# Patient Record
Sex: Female | Born: 1975 | Race: White | Hispanic: No | Marital: Married | Smoking: Current every day smoker
Health system: Southern US, Community
[De-identification: ages and names within clinical notes are randomized; demographics above are authoritative.]

## PROBLEM LIST (undated history)

## (undated) DIAGNOSIS — G8929 Other chronic pain: Secondary | ICD-10-CM

## (undated) DIAGNOSIS — D649 Anemia, unspecified: Secondary | ICD-10-CM

## (undated) HISTORY — DX: Other chronic pain: G89.29

## (undated) HISTORY — DX: Anemia, unspecified: D64.9

## (undated) HISTORY — PX: DILATION AND CURETTAGE OF UTERUS: SHX78

---

## 2001-04-20 ENCOUNTER — Emergency Department (HOSPITAL_COMMUNITY): Admission: EM | Admit: 2001-04-20 | Discharge: 2001-04-21 | Payer: Self-pay | Admitting: Emergency Medicine

## 2001-12-21 ENCOUNTER — Emergency Department (HOSPITAL_COMMUNITY): Admission: EM | Admit: 2001-12-21 | Discharge: 2001-12-21 | Payer: Self-pay | Admitting: Emergency Medicine

## 2003-05-30 ENCOUNTER — Emergency Department (HOSPITAL_COMMUNITY): Admission: EM | Admit: 2003-05-30 | Discharge: 2003-05-30 | Payer: Self-pay | Admitting: Emergency Medicine

## 2003-06-28 ENCOUNTER — Ambulatory Visit (HOSPITAL_COMMUNITY): Admission: RE | Admit: 2003-06-28 | Discharge: 2003-06-28 | Payer: Self-pay | Admitting: Obstetrics and Gynecology

## 2004-01-15 ENCOUNTER — Emergency Department (HOSPITAL_COMMUNITY): Admission: EM | Admit: 2004-01-15 | Discharge: 2004-01-15 | Payer: Self-pay | Admitting: Emergency Medicine

## 2004-02-08 ENCOUNTER — Ambulatory Visit (HOSPITAL_COMMUNITY): Admission: AD | Admit: 2004-02-08 | Discharge: 2004-02-08 | Payer: Self-pay | Admitting: Obstetrics and Gynecology

## 2004-03-21 ENCOUNTER — Ambulatory Visit (HOSPITAL_COMMUNITY): Admission: RE | Admit: 2004-03-21 | Discharge: 2004-03-21 | Payer: Self-pay | Admitting: Obstetrics and Gynecology

## 2004-05-09 ENCOUNTER — Ambulatory Visit (HOSPITAL_COMMUNITY): Admission: AD | Admit: 2004-05-09 | Discharge: 2004-05-09 | Payer: Self-pay | Admitting: Obstetrics and Gynecology

## 2004-05-18 ENCOUNTER — Ambulatory Visit (HOSPITAL_COMMUNITY): Admission: AD | Admit: 2004-05-18 | Discharge: 2004-05-18 | Payer: Self-pay | Admitting: Obstetrics and Gynecology

## 2004-05-26 ENCOUNTER — Ambulatory Visit (HOSPITAL_COMMUNITY): Admission: AD | Admit: 2004-05-26 | Discharge: 2004-05-26 | Payer: Self-pay | Admitting: Obstetrics and Gynecology

## 2004-06-06 ENCOUNTER — Ambulatory Visit (HOSPITAL_COMMUNITY): Admission: AD | Admit: 2004-06-06 | Discharge: 2004-06-06 | Payer: Self-pay | Admitting: Obstetrics and Gynecology

## 2004-06-10 ENCOUNTER — Observation Stay (HOSPITAL_COMMUNITY): Admission: AD | Admit: 2004-06-10 | Discharge: 2004-06-11 | Payer: Self-pay | Admitting: Obstetrics and Gynecology

## 2004-06-12 ENCOUNTER — Ambulatory Visit (HOSPITAL_COMMUNITY): Admission: AD | Admit: 2004-06-12 | Discharge: 2004-06-12 | Payer: Self-pay | Admitting: Obstetrics and Gynecology

## 2004-06-22 ENCOUNTER — Ambulatory Visit (HOSPITAL_COMMUNITY): Admission: AD | Admit: 2004-06-22 | Discharge: 2004-06-22 | Payer: Self-pay | Admitting: Obstetrics & Gynecology

## 2004-07-01 ENCOUNTER — Inpatient Hospital Stay (HOSPITAL_COMMUNITY): Admission: AD | Admit: 2004-07-01 | Discharge: 2004-07-03 | Payer: Self-pay | Admitting: Obstetrics and Gynecology

## 2004-08-01 ENCOUNTER — Ambulatory Visit (HOSPITAL_COMMUNITY): Admission: RE | Admit: 2004-08-01 | Discharge: 2004-08-01 | Payer: Self-pay | Admitting: Obstetrics and Gynecology

## 2005-07-30 ENCOUNTER — Emergency Department (HOSPITAL_COMMUNITY): Admission: EM | Admit: 2005-07-30 | Discharge: 2005-07-30 | Payer: Self-pay | Admitting: Emergency Medicine

## 2005-09-25 ENCOUNTER — Emergency Department (HOSPITAL_COMMUNITY): Admission: EM | Admit: 2005-09-25 | Discharge: 2005-09-25 | Payer: Self-pay | Admitting: Emergency Medicine

## 2006-07-01 ENCOUNTER — Emergency Department (HOSPITAL_COMMUNITY): Admission: EM | Admit: 2006-07-01 | Discharge: 2006-07-01 | Payer: Self-pay | Admitting: Emergency Medicine

## 2006-10-02 ENCOUNTER — Emergency Department (HOSPITAL_COMMUNITY): Admission: EM | Admit: 2006-10-02 | Discharge: 2006-10-02 | Payer: Self-pay | Admitting: Emergency Medicine

## 2007-02-12 ENCOUNTER — Emergency Department (HOSPITAL_COMMUNITY): Admission: EM | Admit: 2007-02-12 | Discharge: 2007-02-12 | Payer: Self-pay | Admitting: Emergency Medicine

## 2008-04-26 ENCOUNTER — Emergency Department (HOSPITAL_COMMUNITY): Admission: EM | Admit: 2008-04-26 | Discharge: 2008-04-26 | Payer: Self-pay | Admitting: Emergency Medicine

## 2010-05-29 ENCOUNTER — Emergency Department (HOSPITAL_COMMUNITY): Admission: EM | Admit: 2010-05-29 | Discharge: 2010-05-29 | Payer: Self-pay | Admitting: Emergency Medicine

## 2011-05-03 NOTE — Consult Note (Signed)
Amy Andersen, Amy Andersen NO.:  [id number]   MEDICAL RECORD NO.:  [id number]                   PATIENT TYPE:   LOCATION:                                       FACILITY:   PHYSICIAN:  Lazaro Arms, M.D.                DATE OF BIRTH:   DATE OF CONSULTATION:  05/09/2004  DATE OF DISCHARGE:                                   CONSULTATION   Patient presented to labor and delivery complaining of lower abdominal  pressure.  She is 32 weeks and 6 days.  She is gravida 4, para 2, abortus 1,  estimated date of delivery 06/28/2004.  She has had no bleeding, no gushes  of fluid, no contractions just low abdominal pressure.  Her cervix is long,  thick and closed.  There are no contractions on the monitor.  She has  reactive NST.  She is discharged to home; follow up in the office as needed  or come back as needed.      ___________________________________________                                            Lazaro Arms, M.D.   Amy Andersen  D:  05/11/2004  T:  05/11/2004  Job:  [id number]

## 2011-05-03 NOTE — Op Note (Signed)
   [name], Amy Andersen                     ACCOUNT NO.:  [id number]   MEDICAL RECORD NO.:  [id number]                   PATIENT TYPE:  AMB   LOCATION:  DAY                                  FACILITY:  [facility]   PHYSICIAN:  Tilda Burrow, M.D.              DATE OF BIRTH:  1976-07-09   DATE OF PROCEDURE:  DATE OF DISCHARGE:                                 OPERATIVE REPORT   PREOPERATIVE DIAGNOSIS:  Incomplete abortion.   POSTOPERATIVE DIAGNOSIS:  Incomplete abortion.   PROCEDURE:  Suction dilatation and curettage.   SURGEON:  Tilda Burrow, M.D.   ASSISTANT:  None   ANESTHESIA:  Paracervical block with IV sedation   COMPLICATIONS:  None   SURGEON:  Tilda Burrow, M.D.   ASSISTANT:  None.   ANESTHESIA:  Spinal   COMPLICATIONS:  None.   FINDINGS:  Uterus sounding to 9 cm.  Generous products of conception and  tissue and old blood consistent with missed AB.   DETAILS OF PROCEDURE:  The patient was taken to the operating room and  prepped and draped in the usual fashion for vaginal procedure with speculum  inserted.  Four green towels placed in periphery of the introitus.  The  vagina prepped with Betadine, cervix infiltrated with 1% Xylocaine, grasped  with single-tooth tenaculum, and paracervical block applied at 3, 4, 5, 7,  8, and 9 o'clock around the cervix; for a total of 10 cc.  Dilation to 81  [place] was followed by introduction of a #9, curved, suction curette which  was allowed [place] to perform suction curettage under 40 mm suction pressure,  obtaining the tissue noted above.   Smooth sharp curettage, in all quadrants, confirmed uterine evacuation.  The  patient tolerated the procedure well and went to recovery in good condition.                                               Tilda Burrow, M.D.    JVF/MEDQ  D:  06/28/2003  T:  06/28/2003  Job:  [id number]

## 2011-05-03 NOTE — H&P (Signed)
   [name], Andersen                     ACCOUNT NO.:  [id number]   MEDICAL RECORD NO.:  [id number]                   PATIENT TYPE:  AMB   LOCATION:  DAY                                  FACILITY:  [facility]   PHYSICIAN:  Tilda Burrow, M.D.              DATE OF BIRTH:  22-May-1976   DATE OF ADMISSION:  06/28/2003  DATE OF DISCHARGE:                                HISTORY & PHYSICAL   ADMISSION DIAGNOSIS:  Pregnancy [redacted] weeks gestation, incomplete abortion.   HISTORY OF PRESENT ILLNESS:  This is a 35 year old female, gravida 3, para  2, AB 0, with two living children.  LMP April 09, 2003, placing her at 11  weeks 3 days.  She is admitted at this time for suction D&C.  She had  ultrasound confirmation of an intrauterine gestational sac with fetal heart  motion noted at 6 weeks 4 days and 7 weeks 4 days.  She has progesterone  levels that were suboptimal at 6.9.  She had no spotting and did well for  awhile.  She presented to our office on the morning of June 28, 2003 with  heavy bleeding with clots since 6 a.m.  The ultrasound reveals no viable  fetus and heavy bleeding.  The patient is, therefore, admitted for suction  D&C.  There is tissue in the uterus.  She has not passed any gestational sac  and she is fully understanding of the inevitability of the pregnancy loss.   The patient's prenatal course had also been notable for progesterone vaginal  suppositories with treatment resulting in mild elevation of progesterone to  90 on June 09, 2003.   PAST MEDICAL HISTORY:  Benign.   PAST SURGICAL HISTORY:  Negative.   ALLERGIES:  None.   PHYSICAL EXAMINATION:  VITAL SIGNS:  Height 5 feet 7 inches, weight 195,  blood pressure 120/68.  GENERAL:  Alert and oriented, Caucasian female.  CARDIOVASCULAR:  Unremarkable.  ABDOMEN:  Nontender.  GU:  External genitalia normal female.  Vaginal exam - heavy blood with  generous clots per vagina.  Ultrasound shows anteflexed uterus with  no  gestational sac visible but tissue greater than 1 cm thick.   LABORATORY DATA:  Blood type O positive.  UDS negative. Hemoglobin 13,  hematocrit 39.7.  Hepatitis, HIV, GC, and Chlamydia all negative.  Hepatitis, HIV and RPR are negative.   PLAN:  Suction D&C on June 28, 2003.                                               Tilda Burrow, M.D.   JVF/MEDQ  D:  06/28/2003  T:  06/28/2003  Job:  [id number]

## 2011-05-03 NOTE — Op Note (Signed)
Amy Andersen, Amy Andersen                     ACCOUNT NO.:  [id number]   MEDICAL RECORD NO.:  [id number]                   PATIENT TYPE:  INP   LOCATION:  A419                                 FACILITY:  [facility]   PHYSICIAN:  Tilda Burrow, M.D.              DATE OF BIRTH:  [birth year]   DATE OF PROCEDURE:  DATE OF DISCHARGE:                                 OPERATIVE REPORT   LABOR SUMMARY AND DELIVERY NOTE   TIME OF DELIVERY:  1: 13 a.m. on 07/02/2004-spontaneous vertex vaginal  delivery.   INDICATIONS:  Admitted for induction at 40+ weeks gestation.   DETAILS OF PROCEDURE:  Patient progressed in response to the balloon and to  active labor pattern.  She had the balloon placed at approximately 6:20 p.m.  when she was 3 cm, progressed to 6 cm at 11 p.m. with membranes intact.  She  reached completely dilated shortly before 1 p.m. and delivered over an  intact perineum.  Membranes ruptured just before delivery.  There was light  meconium present.  This was quite watery.  The baby was delivered over an  intact perineum and the baby cried immediately upon delivery.  There was no  time or opportunity to consider DeLee suction.  The amniotic fluid was very  thin and watery, with no particulate matter in the fluid.  The cord was  clamped.  The infant placed in the warmer, suctioned vigorously as well as  well as having been immediately suctioned after delivery.   The placenta delivered intact, Duncan presentation after cord blood gases  were obtained.  The patient tolerated the procedure well.  Delivery was  attended by 2 family members.   ADDENDUM:  Infant 7 pounds 14 ounces; female; Apgars 9 and 9.      ___________________________________________                                            Tilda Burrow, M.D.   JVF/MEDQ  D:  07/02/2004  T:  07/02/2004  Job:  [id number]

## 2011-05-03 NOTE — H&P (Signed)
Amy Andersen, Amy Andersen                     ACCOUNT NO.:  [id number]   MEDICAL RECORD NO.:  [id number]                   PATIENT TYPE:  OIB   LOCATION:  A415                                 FACILITY:  [facility]   PHYSICIAN:  Tilda Burrow, M.D.              DATE OF BIRTH:  [birth year]   DATE OF ADMISSION:  07/01/2004  DATE OF DISCHARGE:  06/22/2004                                HISTORY & PHYSICAL   HISTORY OF PRESENT ILLNESS:  The patient is a 35 year old Gravida IV, Para  2, Living 2 with an EDC of June 28, 2004 based on sure last menstrual period  with very closely correlating first and second trimester ultrasounds placing  her at 33 and 1/[redacted] weeks gestation.  She strongly desires induction of labor  due to cervical favorability and post dates.  She understands the risks  associated with induction are the same as spontaneous labor and accepts this  and again strongly desires to be induced.   PRENATAL LABS:  Blood type O positive. Rubella immune. HG SAG, HIV, RPR,  gonorrhea and Chlamydia are all negative.  MSAFP was normal at 1 in 8400.  GBS was negative.  She did have an elevated one hour GGT at 146 with a  normal three hour GGT.  HSV is also negative.   Blood pressures have been 100 to 130's over 60's to 80's. Total weight gain  has been 17 pounds with an appropriate fundal height growth.  Her prenatal  course has basically been uneventful.   PAST MEDICAL HISTORY:  Noncontributory.   PAST SURGICAL HISTORY:  1. D&C in July of 2004 for a miscarriage.   ALLERGIES:  No known drug allergies.   SOCIAL HISTORY:  She is married.  She denies cigarette, alcohol or  recreational drug use.  She is a stay at home Mom.   OB HISTORY:  Positive for two vaginal deliveries, one in 1998 and one in  2001 at 40 weeks, two female infants between 6 pounds 13 ounces and 7 pounds  15 ounces all by [facility] employees.  All labors have been 12 to  13 hours.   PHYSICAL EXAMINATION:   HEENT:  Within normal limits.  HEART:  Regular rate and rhythm.  LUNGS:  Clear.  ABDOMEN:  Fundal height is 38 cm.  PELVIC:  Cervix is 2 cm, long, posterior, vertex presentation at -1 station  with a vertex well applied to the cervix.  LEGS:  Negative.   IMPRESSION:  Intrauterine pregnancy at 76 and 1/2 weeks for induction of  labor per request of patient due to post dates.   PLAN:  1. We will plan a Foley bulb for Sunday, July 01, 2004 with artificial     rupture of membranes and Pitocin on Monday morning.     _____________________________________  ___________________________________________  Zenovia Jordan, P.A.  Tilda Burrow, M.D.   RRK/MEDQ  D:  06/26/2004  T:  06/26/2004  Job:  [id number]

## 2011-05-03 NOTE — Consult Note (Signed)
[name], CRADY                     ACCOUNT NO.:  [id number]   MEDICAL RECORD NO.:  [id number]                   PATIENT TYPE:  OIB   LOCATION:  A427                                 FACILITY:  [facility]   PHYSICIAN:  Lazaro Arms, M.D.                DATE OF BIRTH:  1976-10-05   DATE OF CONSULTATION:  03/21/2004  DATE OF DISCHARGE:  03/21/2004                                   CONSULTATION   LABOR AND DELIVERY OBSERVATION NOTE   [name] is a 35 year old white female, gravida 4, para 2, abortus 1 with  estimated date of delivery of June 28, 2004.  Currently at 25-1/[redacted] weeks  gestation who presented to labor and delivery complaining of a small amount  of bright red spotting when she wiped using the restroom.  She has had no  further bleeding.  She has gone to the bathroom subsequently has had just a  little dark-brown smudging.  She also complained of some discomfort in her  lower back, which is really there only with significant movement.  The baby  has been moving well.  She has had no other significant bleeding while here  in labor and delivery.  The fetal heart rate tracing is reassuring.  Ultrasound previously shows that she does not have a previa.   Her cervix, this morning, is long thick and closed on examination and there  was no blood in the vault at all, either dark brown or bright red.   As a result we are going to discharge her home.  Her complete prenatal  record is reviewed.  We discharged her home on bed rest today; and she can  return to normal activities tomorrow.  If she has any other problems in the  meantime she is to give [place] a call.   IMPRESSION:  1. Intrauterine pregnancy at 25+ weeks gestation.  2. Vaginal spotting.   PLAN:  Bed rest today then resume normal activities and pelvic rest.   PROCEDURE:  Observation and interpretation of NST.      ___________________________________________                                            Lazaro Arms,  M.D.   LHE/MEDQ  D:  03/21/2004  T:  03/22/2004  Job:  [id number]

## 2011-05-03 NOTE — H&P (Signed)
NAMEJAMITA, MCKELVIN NO.:  [id number]   MEDICAL RECORD NO.:  [id number]                   PATIENT TYPE:   LOCATION:                                       FACILITY:   PHYSICIAN:  Tilda Burrow, M.D.              DATE OF BIRTH:   DATE OF ADMISSION:  DATE OF DISCHARGE:                                HISTORY & PHYSICAL   ADMISSION DIAGNOSIS:  Desire for elective permanent sterilization.   HISTORY OF PRESENT ILLNESS:  This 35 year old female gravida 4, para 3,  status post vaginal delivery one month ago was admitted for laparoscopic  tubal sterilization with Falope ring.  The patient was seen in our office  and requested permanent sterilization.  She has received [employer]  instructional booklet with visual guide reviewed, with specific mention of  failure rate of 1 in 100, with need for any suspecting pregnancy to be  rechecked.  The patient understood the procedure, denies any further  questions, and desires permanent sterilization at this time.  It has been  scheduled for 08/02/2004.   PAST MEDICAL HISTORY:  Benign.   PAST SURGICAL HISTORY:  D&C in 2004 for miscarriage.   ALLERGIES:  NONE KNOWN.   SOCIAL HISTORY:  Married.   HABITS:  Negative for cigarettes, alcohol, or recreations drug use.  She is  a stay-at-home mom.   PHYSICAL EXAMINATION:  VITAL SIGNS:  Height 5 feet 8 inches, weight 203,  blood pressure 120/80, pulse 70.  GENERAL:  Healthy Caucasian female who is alert and oriented x 3.  HEENT:  Pupils are equal, round, and reactive.  Extraocular movements  intact.  ABDOMEN:  Nontender.  External genitalia normal.  Cervix multiparous.  Physiologic secretions.  Uterus nontender, upper limits of normal size.  Adnexa negative for masses or tenderness.   LABORATORY DATA:  Recent pregnancy labs include a negative GC and Chlamydia  cultures.   PLAN:  Laparoscopic tubal sterilization with Falope rings on 08/02/2004.     ___________________________________________                                         Tilda Burrow, M.D.   JVF/MEDQ  D:  07/31/2004  T:  07/31/2004  Job:  [id number]

## 2011-05-31 ENCOUNTER — Emergency Department (HOSPITAL_COMMUNITY)
Admission: EM | Admit: 2011-05-31 | Discharge: 2011-05-31 | Disposition: A | Discharge status: Home / Self Care | Payer: Medicaid Other | Attending: Emergency Medicine | Admitting: Emergency Medicine

## 2011-05-31 DIAGNOSIS — L509 Urticaria, unspecified: Secondary | ICD-10-CM | POA: Insufficient documentation

## 2011-05-31 DIAGNOSIS — F17200 Nicotine dependence, unspecified, uncomplicated: Secondary | ICD-10-CM | POA: Insufficient documentation

## 2011-06-09 IMAGING — CR DG KNEE COMPLETE 4+V*R*
4 series · 4 of 4 positions shown · non-contrast
Comparison: None

CLINICAL DATA: Right knee pain, twist injury

RIGHT KNEE - COMPLETE 4+ VIEW

[view not recorded (1 of 4)]
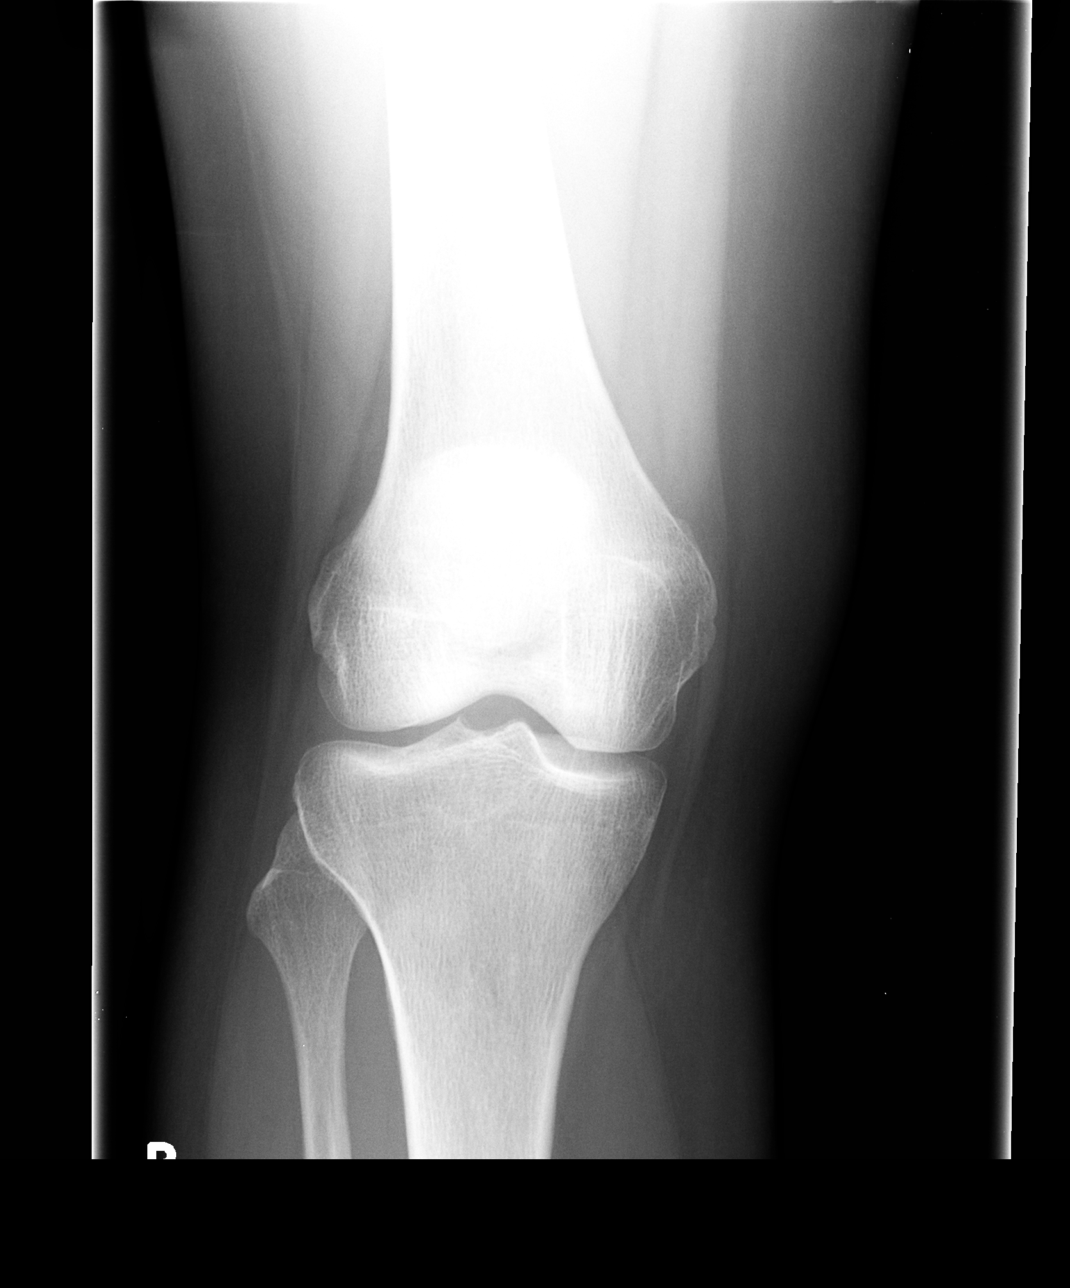

[view not recorded (2 of 4)]
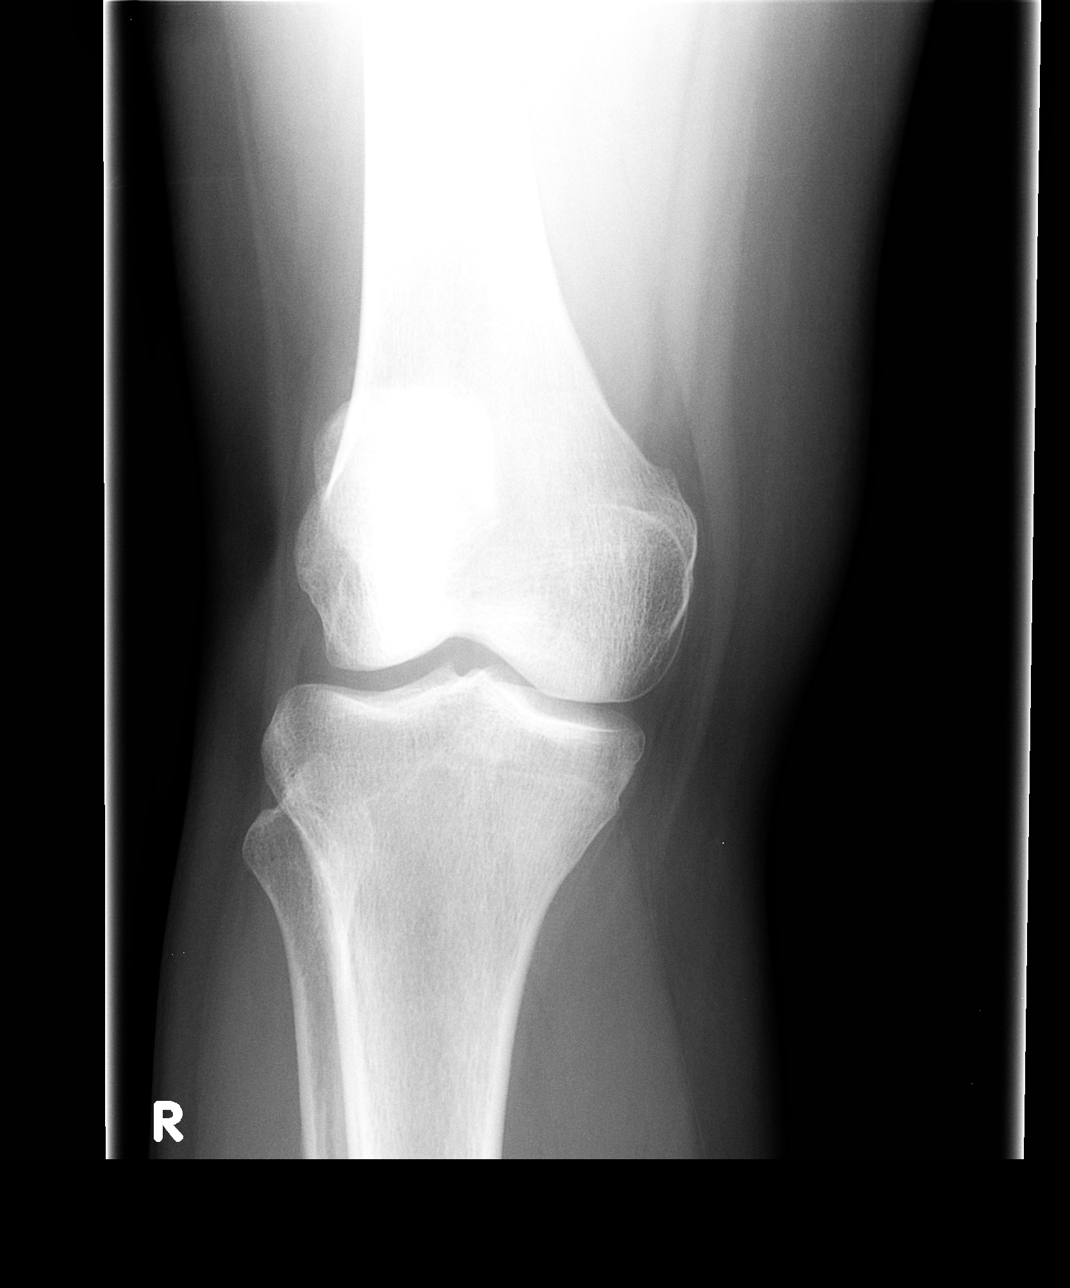

[view not recorded (3 of 4)]
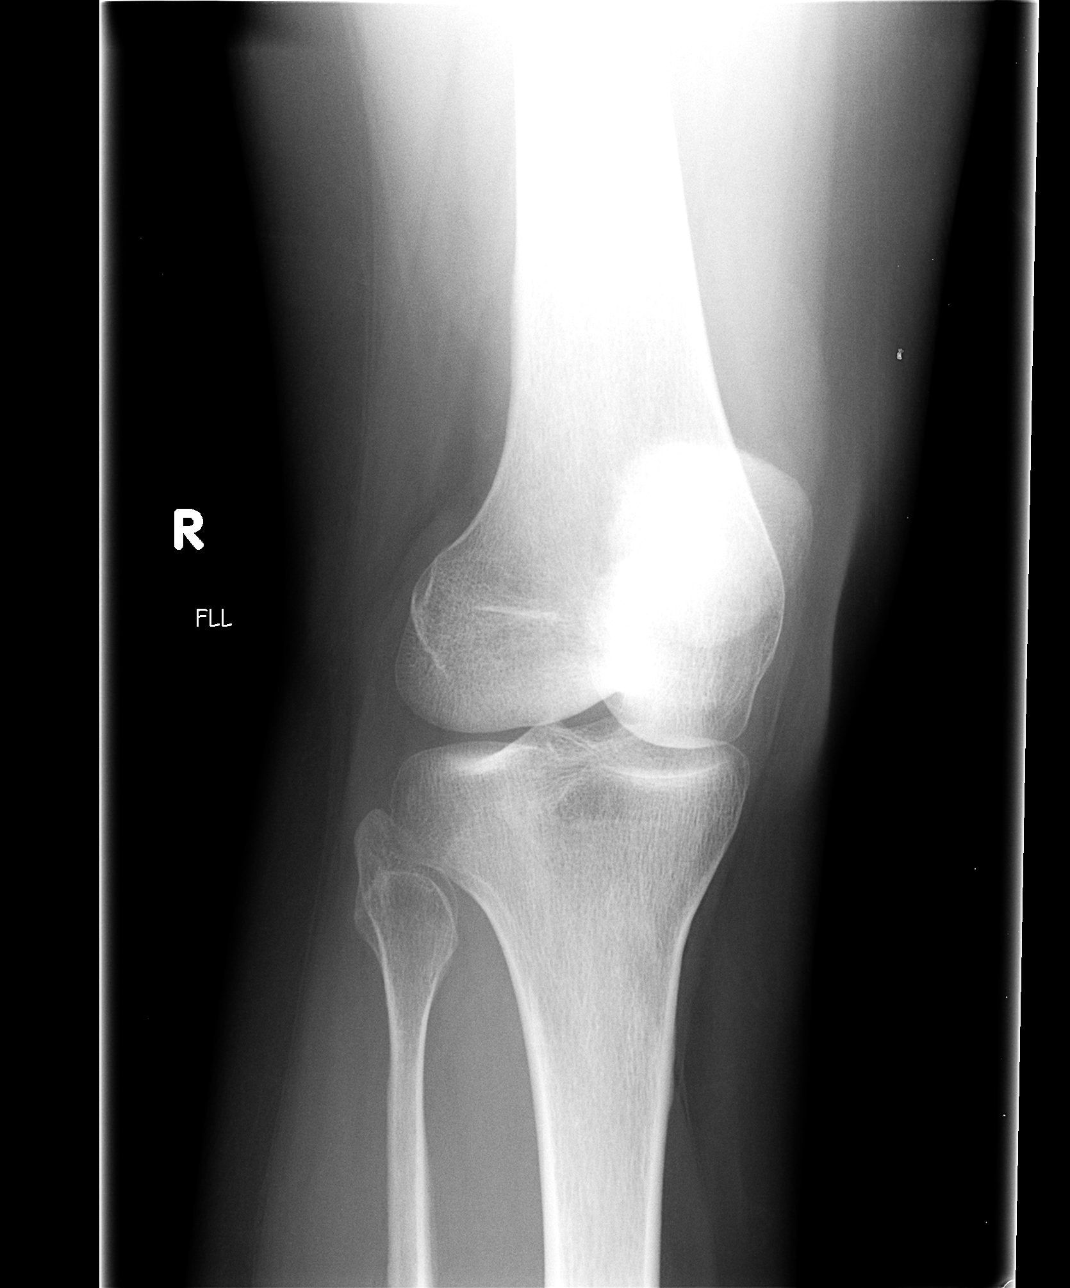

[view not recorded (4 of 4)]
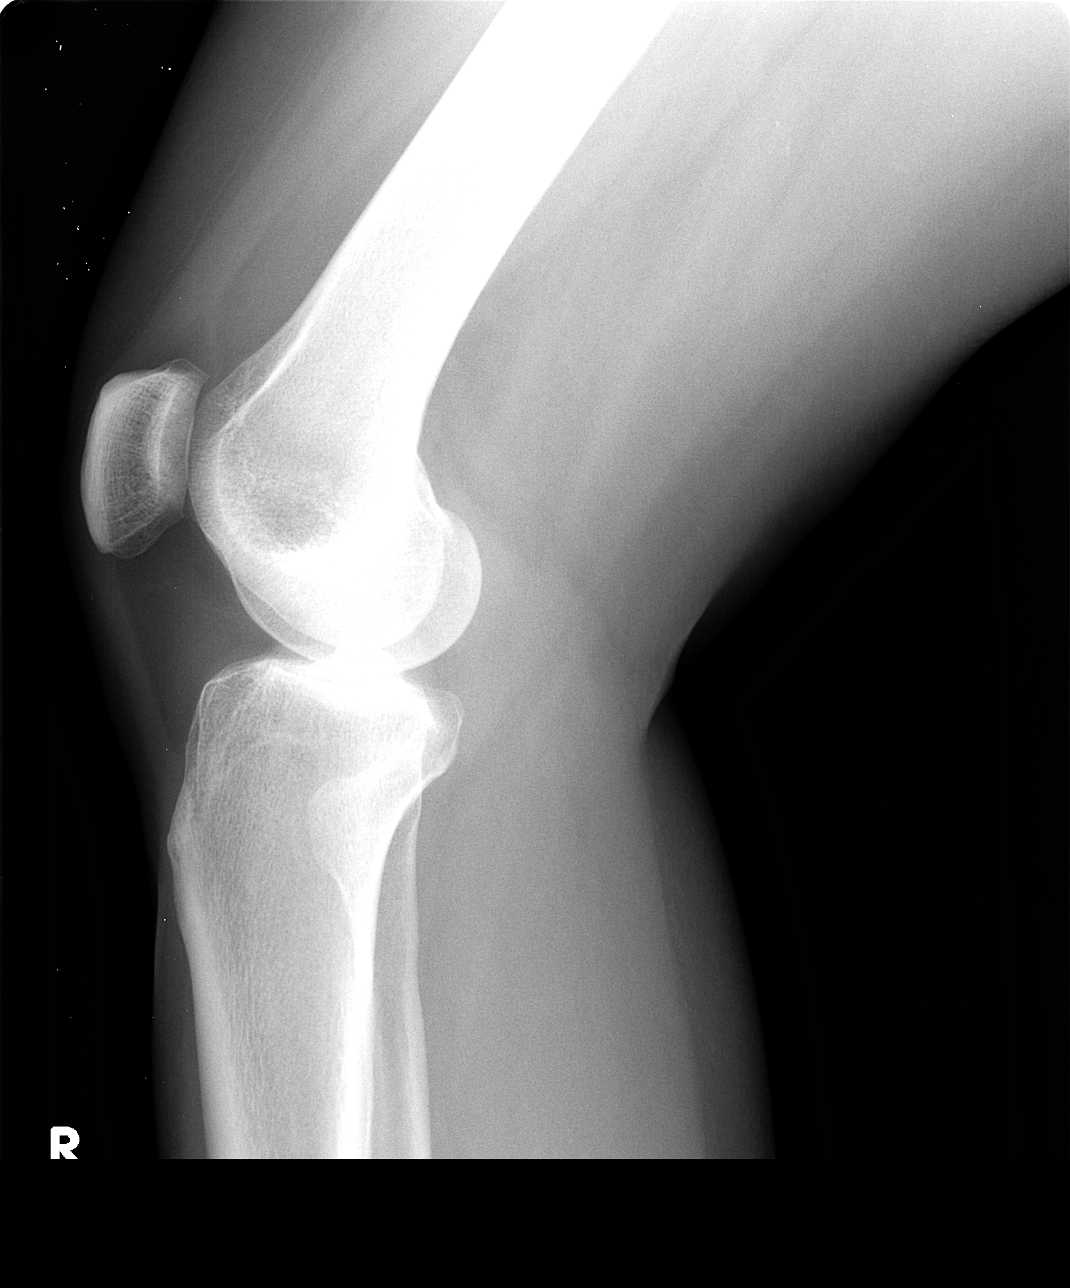

[4 of 4 positions shown; findings below may reference images not displayed]

FINDINGS: Bone mineralization normal.
Joint spaces preserved.
No fracture, dislocation, or bone destruction.
No joint effusion.
IMPRESSION: No acute bony abnormalities identified.

## 2012-12-12 ENCOUNTER — Emergency Department (HOSPITAL_COMMUNITY)
Admission: EM | Admit: 2012-12-12 | Discharge: 2012-12-12 | Disposition: A | Discharge status: Home / Self Care | Payer: Medicaid Other | Attending: Emergency Medicine | Admitting: Emergency Medicine

## 2012-12-12 ENCOUNTER — Emergency Department (HOSPITAL_COMMUNITY): Payer: Medicaid Other

## 2012-12-12 ENCOUNTER — Encounter (HOSPITAL_COMMUNITY): Payer: Self-pay

## 2012-12-12 DIAGNOSIS — X500XXA Overexertion from strenuous movement or load, initial encounter: Secondary | ICD-10-CM | POA: Insufficient documentation

## 2012-12-12 DIAGNOSIS — S46911A Strain of unspecified muscle, fascia and tendon at shoulder and upper arm level, right arm, initial encounter: Secondary | ICD-10-CM

## 2012-12-12 DIAGNOSIS — Y9389 Activity, other specified: Secondary | ICD-10-CM | POA: Insufficient documentation

## 2012-12-12 DIAGNOSIS — Y92009 Unspecified place in unspecified non-institutional (private) residence as the place of occurrence of the external cause: Secondary | ICD-10-CM | POA: Insufficient documentation

## 2012-12-12 DIAGNOSIS — F17200 Nicotine dependence, unspecified, uncomplicated: Secondary | ICD-10-CM | POA: Insufficient documentation

## 2012-12-12 DIAGNOSIS — IMO0002 Reserved for concepts with insufficient information to code with codable children: Secondary | ICD-10-CM | POA: Insufficient documentation

## 2012-12-12 MED ORDER — NAPROXEN 500 MG PO TABS: 500.0000 mg | ORAL_TABLET | Freq: Two times a day (BID) | ORAL | Status: DC

## 2012-12-12 NOTE — ED Provider Notes (Signed)
History     CSN: [id number]  Arrival date & time 12/12/12  0745   First MD Initiated Contact with Patient 12/12/12 0805      Chief Complaint  Patient presents with  . Shoulder Injury    (Consider location/radiation/quality/duration/timing/severity/associated sxs/prior treatment) HPI Comments: Amy Andersen presents with a 3 day history of pain in her right shoulder since she caught herself with this extremity against a door as she tripped Christmas morning in her home.  She has tried ibuprofen, last dose yesterday with no relief of symptoms.  Pain is quiescent at rest, increased with radiation, sharp and non radiating.  She denies other injury and denies weakness or numbness in her distal right arm and hand.  The history is provided by the patient.    History reviewed. No pertinent past medical history.  History reviewed. No pertinent past surgical history.  No family history on file.  History  Substance Use Topics  . Smoking status: Current Every Day Smoker    Types: Cigarettes  . Smokeless tobacco: Not on file  . Alcohol Use: No    OB History    Grav Para Term Preterm Abortions TAB SAB Ect Mult Living                  Review of Systems  Musculoskeletal: Positive for arthralgias. Negative for back pain and joint swelling.  Skin: Negative for wound.  Neurological: Negative for weakness and numbness.    Allergies  Review of patient's allergies indicates no known allergies.  Home Medications   Current Outpatient Rx  Name  Route  Sig  Dispense  Refill  . NAPROXEN 500 MG PO TABS   Oral   Take 1 tablet (500 mg total) by mouth 2 (two) times daily.   30 tablet   0     BP 141/72  Pulse 110  Temp 97.7 F (36.5 C) (Oral)  Resp 18  Ht 5\' 7"  (1.702 m)  Wt 187 lb (84.823 kg)  BMI 29.29 kg/m2  SpO2 100%  LMP 11/27/2012  Physical Exam  Constitutional: She appears well-developed and well-nourished.  HENT:  Head: Atraumatic.  Neck: Normal range of  motion.  Cardiovascular:       Pulses equal bilaterally  Musculoskeletal: She exhibits tenderness.       Right shoulder: She exhibits tenderness. She exhibits no swelling, no effusion, no crepitus, no spasm, normal pulse and normal strength.       Equal grip srtength.  Radial pulses equal and full.  TTP along lateral shoulder joint space.  No disruption, deformity or muscle spasm noted along the course of the deltoid, bicep and tricep musculature.  Neurological: She is alert. She has normal strength. She displays normal reflexes. No sensory deficit.       Equal strength  Skin: Skin is warm and dry.  Psychiatric: She has a normal mood and affect.    ED Course  Procedures (including critical care time)  Labs Reviewed - No data to display Dg Shoulder Right  12/12/2012  *RADIOLOGY REPORT*  Clinical Data: Trauma and pain.  RIGHT SHOULDER - 2+ VIEW  Comparison: None.  Findings: No acute fracture or dislocation.  Visualized portion of the right hemithorax is normal.  IMPRESSION: Normal right shoulder.   Original Report Authenticated By: Jeronimo Greaves, M.D.      1. Right shoulder strain       MDM  Shoulder sling supplied for comfort.  Instructed in contrast bath technique,  Naproxen prescribed.  Pt to f/u with Dr Romeo Apple if not improving over the next week, referral given.  No evidence of obvious injury,  Xray reviewed and normal,  Suspect simple joint strain,  Advised pt she may have a rotator injury if sx do not improve with conservative tx,  Should get examined further.        Burgess Amor, [place] 12/12/12 [phone]

## 2012-12-12 NOTE — ED Notes (Signed)
Injured right shoulder Wednesday, trying to stop self from falling.

## 2012-12-15 NOTE — ED Provider Notes (Signed)
Medical screening examination/treatment/procedure(s) were performed by non-physician practitioner and as supervising physician I was immediately available for consultation/collaboration.  Carrieann Spielberg, MD 12/15/12 1359 

## 2013-12-23 IMAGING — CR DG SHOULDER 2+V*R*
3 series · 3 of 3 positions shown · non-contrast
Comparison: None.

CLINICAL DATA: Trauma and pain.

RIGHT SHOULDER - 2+ VIEW

[view not recorded (1 of 3)]
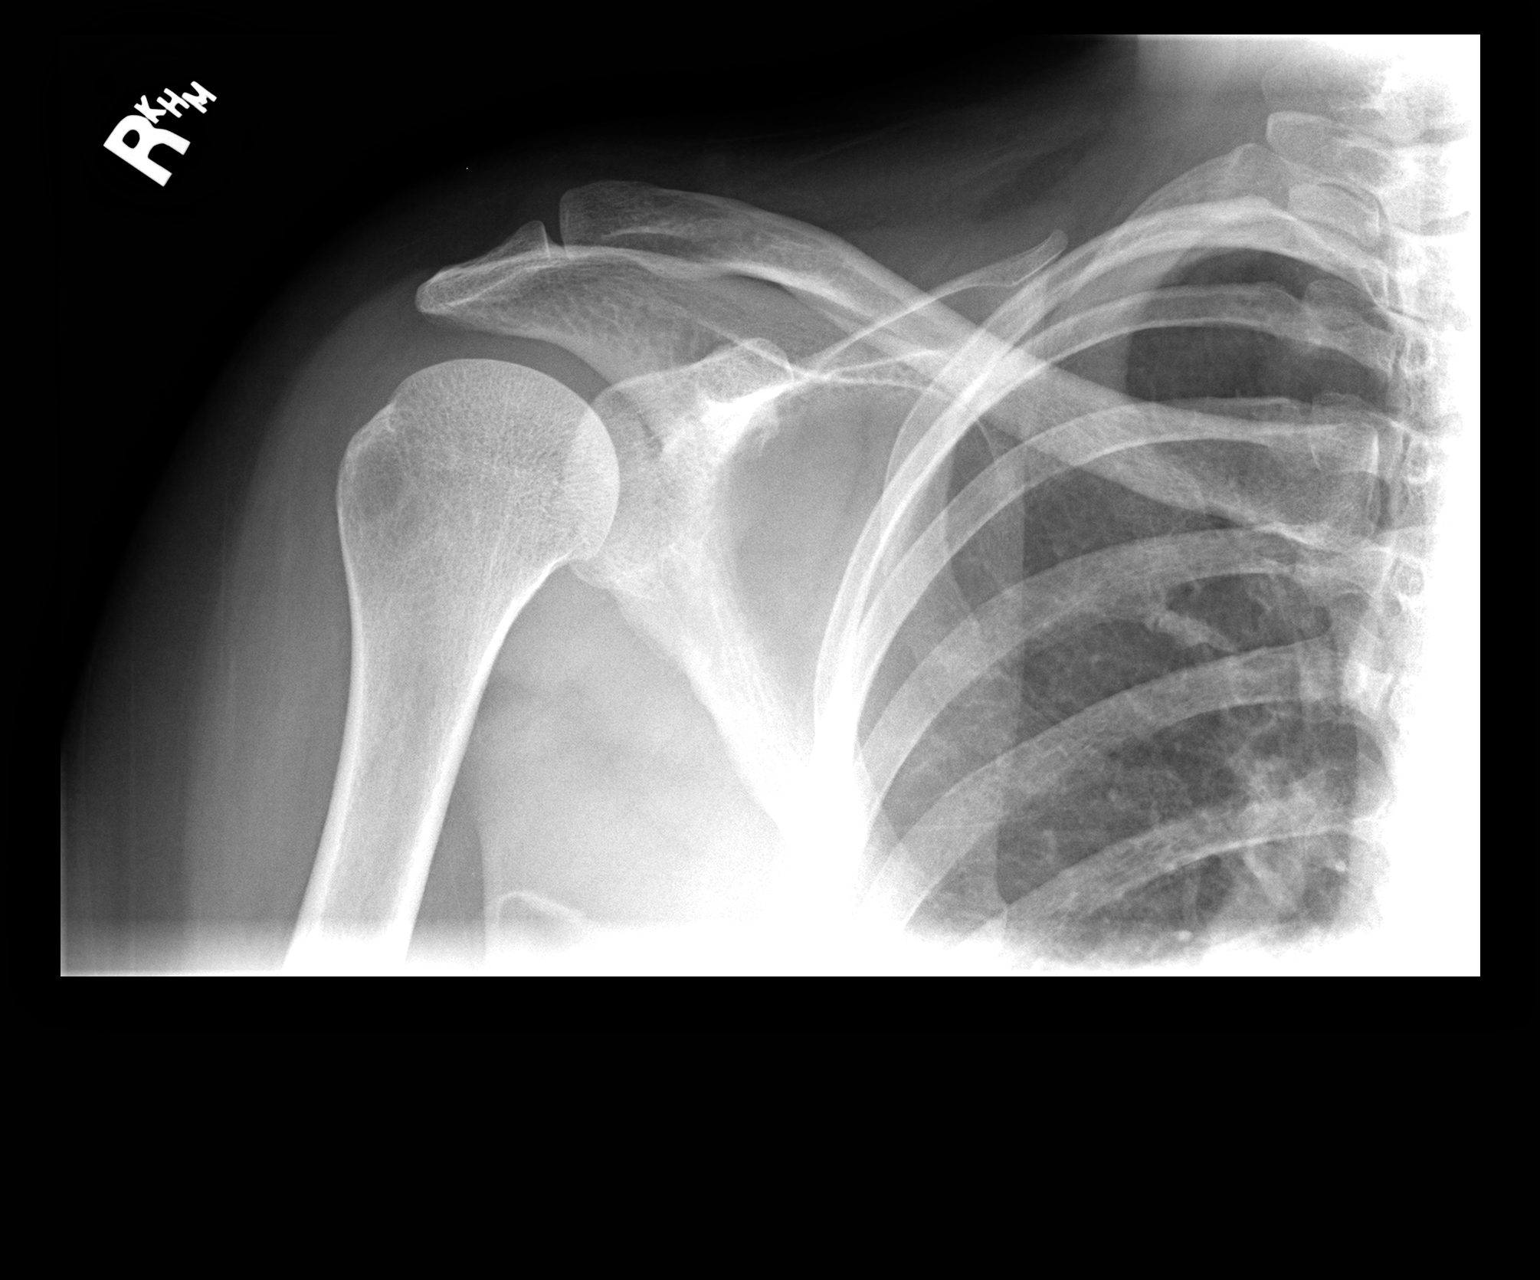

[view not recorded (2 of 3)]
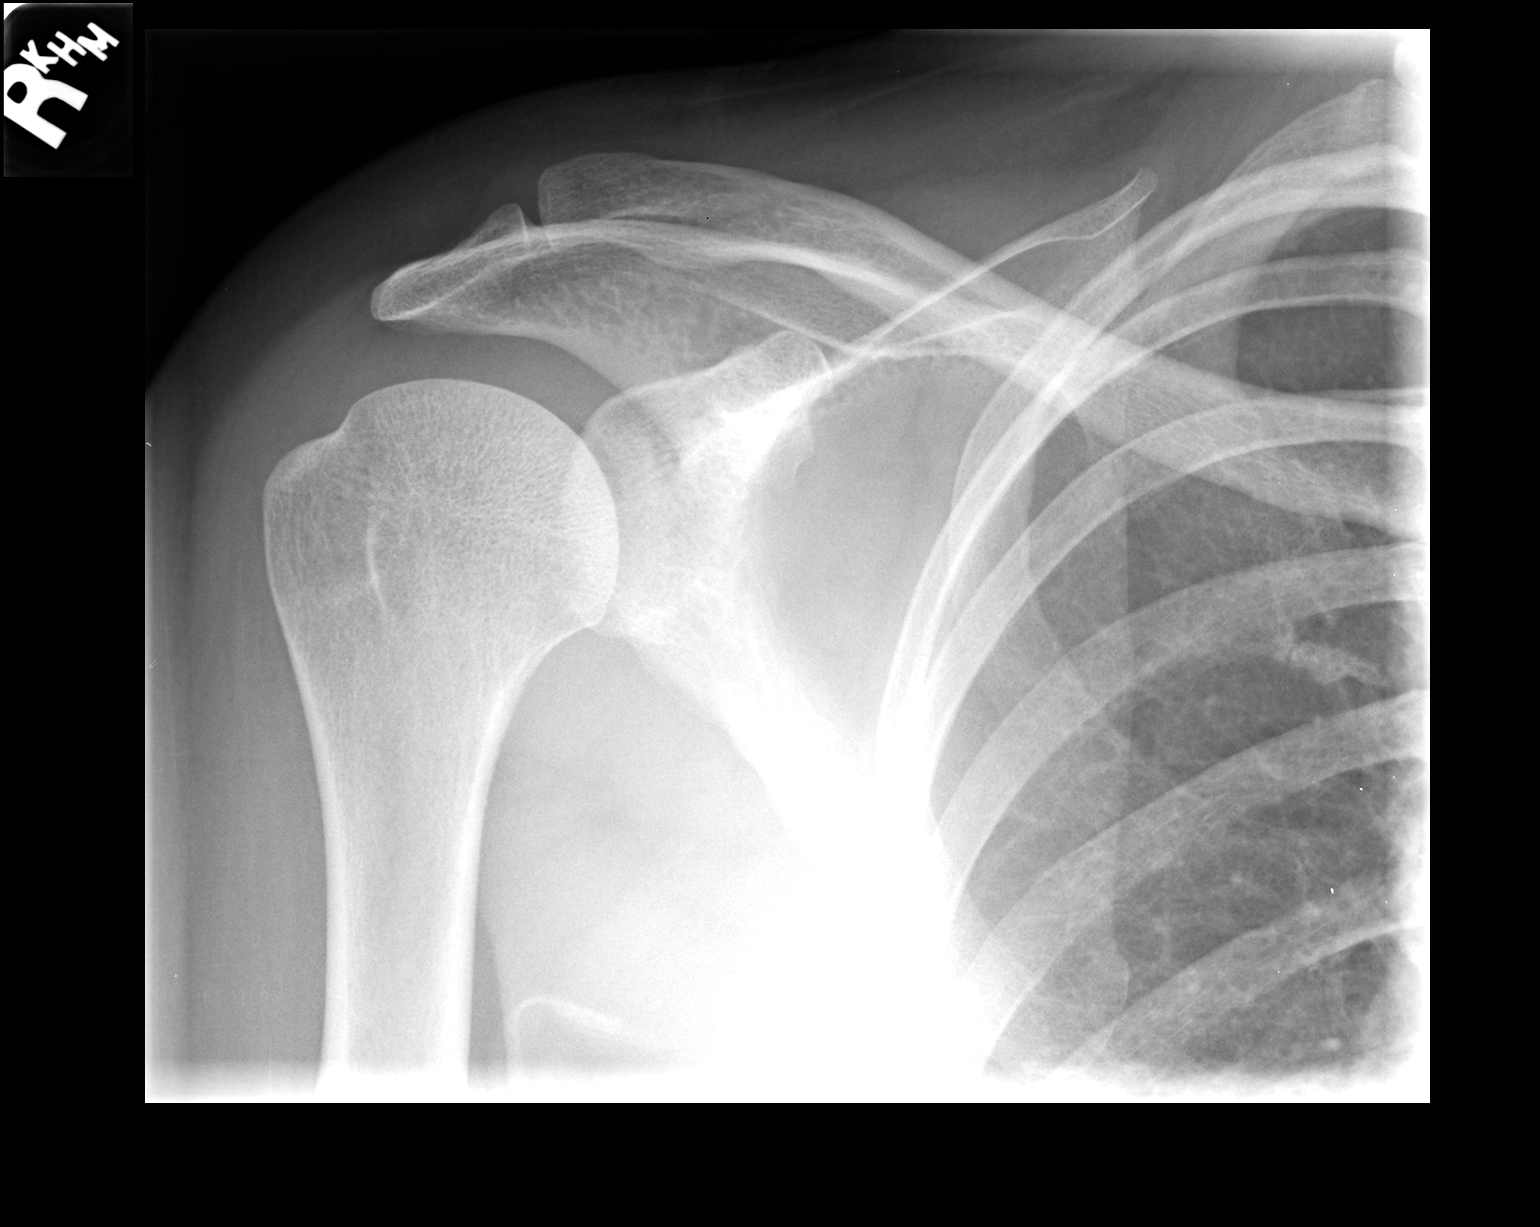

[view not recorded (3 of 3)]
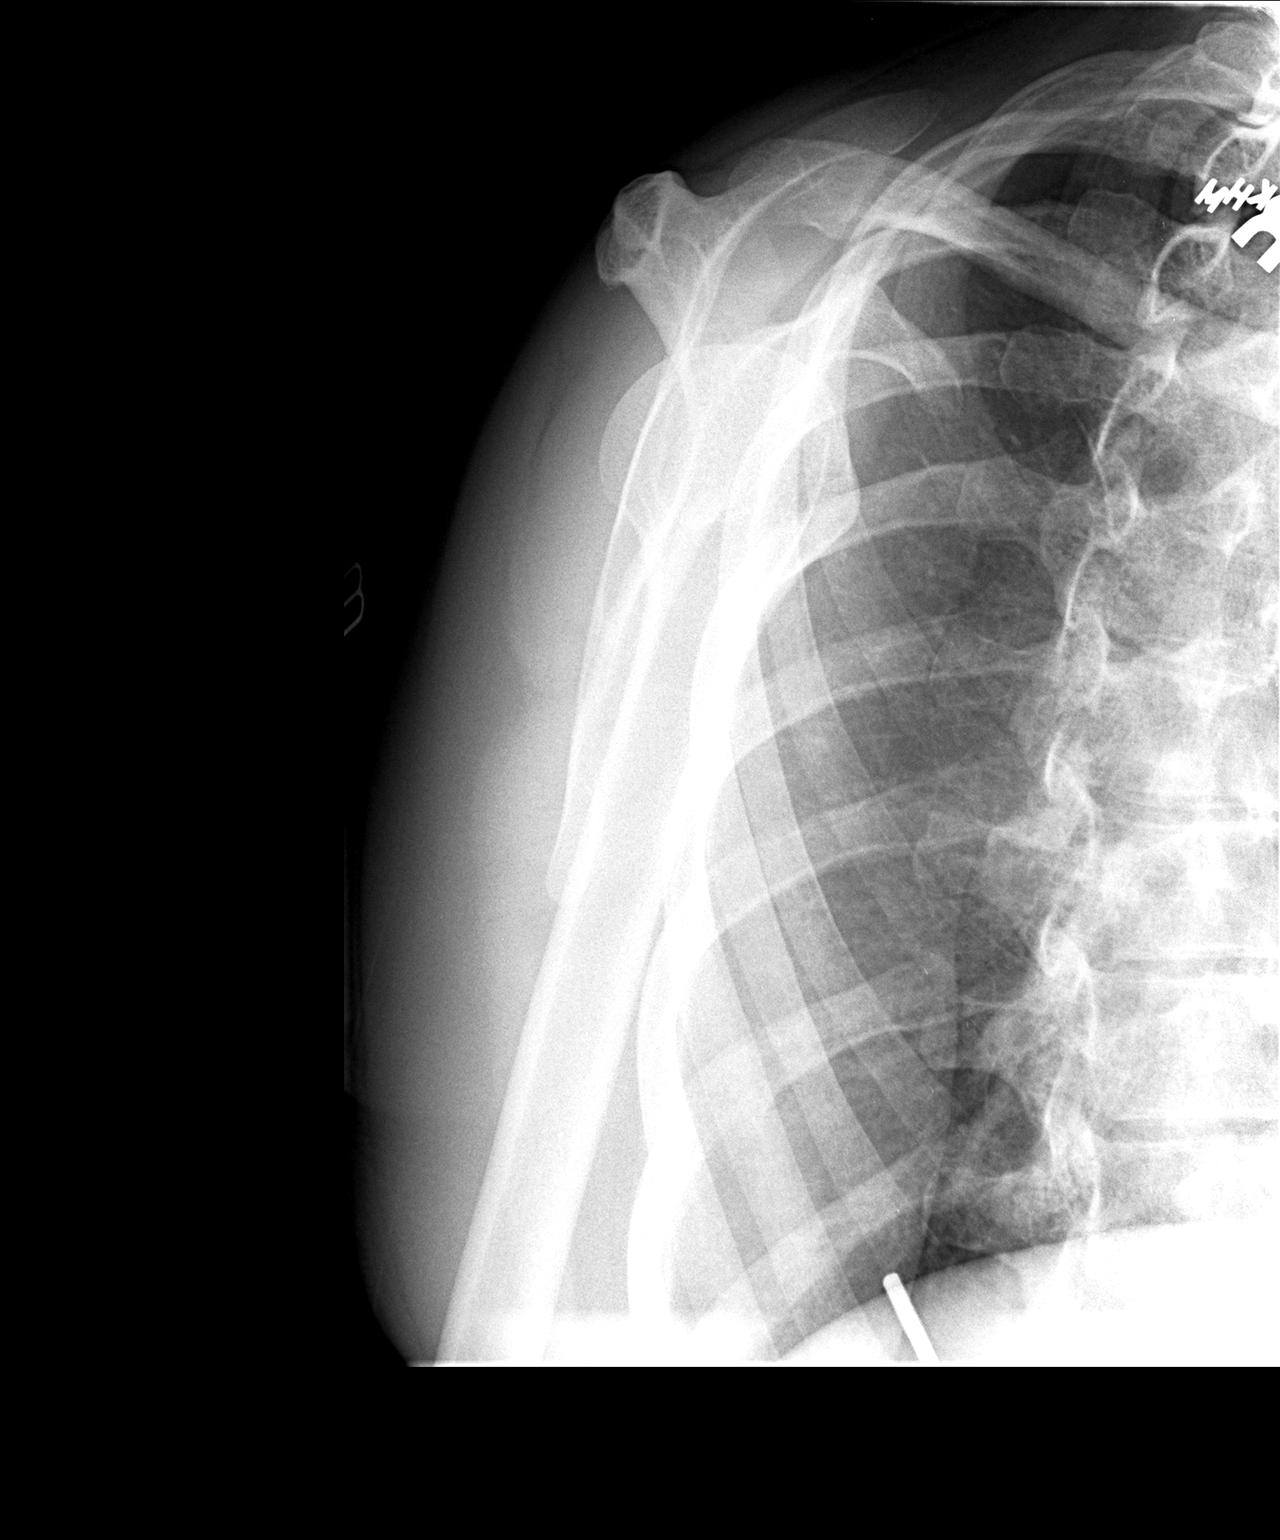

[3 of 3 positions shown; findings below may reference images not displayed]

FINDINGS: No acute fracture or dislocation.  Visualized portion of
the right hemithorax is normal.
IMPRESSION: Normal right shoulder.

## 2016-01-29 ENCOUNTER — Telehealth: Payer: Self-pay | Admitting: *Deleted

## 2016-01-29 MED ORDER — HYDROCODONE-ACETAMINOPHEN 7.5-325 MG PO TABS: 1.0000 | ORAL_TABLET | ORAL | Status: DC | PRN

## 2016-01-29 NOTE — Telephone Encounter (Signed)
Patient called requesting Norco 7.5/325 mg aty 120 tabs to [facility]. Please advise

## 2016-01-29 NOTE — Telephone Encounter (Signed)
Left message informing patient her hydrocodone rx is ready for pick up. 

## 2016-01-29 NOTE — Telephone Encounter (Signed)
Rx done. 

## 2016-02-14 ENCOUNTER — Encounter: Payer: Self-pay | Admitting: Orthopaedic Surgery

## 2016-02-14 ENCOUNTER — Ambulatory Visit: Payer: Medicaid Other | Admitting: Orthopaedic Surgery

## 2016-02-21 ENCOUNTER — Ambulatory Visit (INDEPENDENT_AMBULATORY_CARE_PROVIDER_SITE_OTHER): Payer: Self-pay | Admitting: Orthopaedic Surgery

## 2016-02-21 ENCOUNTER — Encounter: Payer: Self-pay | Admitting: Orthopaedic Surgery

## 2016-02-21 VITALS — BP 150/81 | HR 110 | Temp 99.3°F | Ht 67.0 in | Wt 201.8 lb

## 2016-02-21 DIAGNOSIS — M25561 Pain in right knee: Secondary | ICD-10-CM

## 2016-02-21 MED ORDER — NAPROXEN 500 MG PO TABS: 500.0000 mg | ORAL_TABLET | Freq: Two times a day (BID) | ORAL | Status: DC

## 2016-02-21 MED ORDER — HYDROCODONE-ACETAMINOPHEN 7.5-325 MG PO TABS: 1.0000 | ORAL_TABLET | ORAL | Status: DC | PRN

## 2016-02-21 NOTE — Progress Notes (Signed)
Patient ZO:[id number] Amy Andersen, female DOB:1976-09-15, 40 y.o. JWJ:[id number]  Chief Complaint  Patient presents with  . Follow-up    right knee    HPI  Amy Andersen is a 40 y.o. female who has chronic right knee pain.  She has chondromalacia as well.  She has popping and some swelling.  She has no giving way, no locking and no redness.  Knee Pain  The pain is present in the right knee. The quality of the pain is described as aching. The pain is at a severity of 3/10. The pain is mild. The pain has been fluctuating since onset. The symptoms are aggravated by weight bearing. She has tried ice, heat, NSAIDs and rest for the symptoms. The treatment provided moderate relief.    Body mass index is 31.6 kg/(m^2).  Review of Systems  Patient does not have Diabetes Mellitus. Patient does not have hypertension. Patient does not have COPD or shortness of breath. Patient does not have BMI > 35. Patient has current smoking history.  Review of Systems  History reviewed. No pertinent past medical history.  History reviewed. No pertinent past surgical history.  History reviewed. No pertinent family history.  Social History Social History  Substance Use Topics  . Smoking status: Current Every Day Smoker    Types: Cigarettes  . Smokeless tobacco: None  . Alcohol Use: No    No Known Allergies  Current Outpatient Prescriptions  Medication Sig Dispense Refill  . HYDROcodone-acetaminophen (NORCO) 7.5-325 MG tablet Take 1 tablet by mouth every 4 (four) hours as needed for moderate pain (Must last 30 days.  Do not drive or operate machinery while taking this medicine.). 120 tablet 0  . naproxen (NAPROSYN) 500 MG tablet Take 1 tablet (500 mg total) by mouth 2 (two) times daily with a meal. 60 tablet 5   No current facility-administered medications for this visit.     Physical Exam  Blood pressure 150/81, pulse 110, temperature 99.3 F (37.4 C), height  (1.702 m), weight 201  lb 12.8 oz (91.536 kg).  Constitutional: overall normal hygiene, normal nutrition, well developed, normal grooming, normal body habitus. Assistive device:none  Musculoskeletal: gait and station Limp right, muscle tone and strength are normal, no tremors or atrophy is present.  .  Neurological: coordination overall normal.  Deep tendon reflex/nerve stretch intact.  Sensation normal.  Cranial nerves II-XII intact.   Skin:   normal overall no scars, lesions, ulcers or rashes. No psoriasis.  Psychiatric: Alert and oriented x 3.  Recent memory intact, remote memory unclear.  Normal mood and affect. Well groomed.  Good eye contact.  Cardiovascular: overall no swelling, no varicosities, no edema bilaterally, normal temperatures of the legs and arms, no clubbing, cyanosis and good capillary refill.  Lymphatic: palpation is normal.  The right lower extremity is examined:  Inspection:  Thigh:  Non-tender and no defects  Knee has swelling 1+ effusion.                        Joint tenderness is present                        Patient is tender over the medial joint line  Lower Leg:  Has normal appearance and no tenderness or defects  Ankle:  Non-tender and no defects  Foot:  Non-tender and no defects Range of Motion:  Knee:  Range of motion is: 0 to 110  Crepitus is  present  Ankle:  Range of motion is normal. Strength and Tone:  The right lower extremity has normal strength and tone. Stability:  Knee:  The knee is stable.  Ankle:  The ankle is stable.   PLAN Call if any problems.  Precautions discussed.  Continue current medications.   Return to clinic 3 months

## 2016-02-21 NOTE — Patient Instructions (Signed)

## 2016-03-25 ENCOUNTER — Telehealth: Payer: Self-pay | Admitting: Orthopaedic Surgery

## 2016-03-25 MED ORDER — HYDROCODONE-ACETAMINOPHEN 5-325 MG PO TABS: 1.0000 | ORAL_TABLET | ORAL | Status: DC | PRN

## 2016-03-25 NOTE — Telephone Encounter (Addendum)
Patient requests refill, pain medication: HYDROcodone-acetaminophen (NORCO) 7.5-325 MG tablet [[id number]][[id number]] - [phone]#[phone]

## 2016-03-25 NOTE — Telephone Encounter (Signed)
Rx done. 

## 2016-04-24 ENCOUNTER — Telehealth: Payer: Self-pay | Admitting: Orthopaedic Surgery

## 2016-04-24 MED ORDER — HYDROCODONE-ACETAMINOPHEN 5-325 MG PO TABS: 1.0000 | ORAL_TABLET | ORAL | Status: DC | PRN

## 2016-04-24 NOTE — Telephone Encounter (Signed)
Rx done. 

## 2016-04-24 NOTE — Telephone Encounter (Signed)
Hydrocodone-Acetaminophen  5/325mg  Qty 120 Tablets °

## 2016-05-23 ENCOUNTER — Encounter: Payer: Self-pay | Admitting: Orthopaedic Surgery

## 2016-05-23 ENCOUNTER — Ambulatory Visit (INDEPENDENT_AMBULATORY_CARE_PROVIDER_SITE_OTHER): Payer: Self-pay | Admitting: Orthopaedic Surgery

## 2016-05-23 VITALS — BP 134/85 | HR 100 | Temp 98.1°F | Resp 16 | Ht 67.0 in | Wt 204.0 lb

## 2016-05-23 DIAGNOSIS — M25561 Pain in right knee: Secondary | ICD-10-CM

## 2016-05-23 MED ORDER — HYDROCODONE-ACETAMINOPHEN 5-325 MG PO TABS: 1.0000 | ORAL_TABLET | ORAL | Status: DC | PRN

## 2016-05-23 NOTE — Progress Notes (Signed)
Patient WG:[id number] Amy Andersen, female DOB:[birth year], 40 y.o. VHQ:[id number]  Chief Complaint  Patient presents with  . Follow-up    RIGHT KNEE    HPI  Amy Andersen is a 40 y.o. female who has chronic right knee pain. She has swelling, popping and occasional giving way.  She has no insurance and cannot afford MRI.  She is active, taking her medicine.  HPI  Body mass index is 31.94 kg/(m^2).  ROS  Review of Systems  HENT: Negative for congestion.   Respiratory: Negative for cough and shortness of breath.   Cardiovascular: Negative for chest pain and leg swelling.  Endocrine: Positive for cold intolerance.  Musculoskeletal: Positive for joint swelling, arthralgias and gait problem.  Allergic/Immunologic: Positive for environmental allergies.    History reviewed. No pertinent past medical history.  History reviewed. No pertinent past surgical history.  History reviewed. No pertinent family history.  Social History Social History  Substance Use Topics  . Smoking status: Current Every Day Smoker    Types: Cigarettes  . Smokeless tobacco: None  . Alcohol Use: No    No Known Allergies  Current Outpatient Prescriptions  Medication Sig Dispense Refill  . HYDROcodone-acetaminophen (NORCO/VICODIN) 5-325 MG tablet Take 1 tablet by mouth every 4 (four) hours as needed for moderate pain (Must last 30 days.  Do not take and drive a car or use machinery.). 120 tablet 0  . naproxen (NAPROSYN) 500 MG tablet Take 1 tablet (500 mg total) by mouth 2 (two) times daily with a meal. 60 tablet 5   No current facility-administered medications for this visit.     Physical Exam  Blood pressure 134/85, pulse 100, temperature 98.1 F (36.7 C), resp. rate 16, height  (1.702 m), weight 204 lb (92.534 kg).  Constitutional: overall normal hygiene, normal nutrition, well developed, normal grooming, normal body habitus. Assistive device:none  Musculoskeletal: gait and station Limp  right, muscle tone and strength are normal, no tremors or atrophy is present.  .  Neurological: coordination overall normal.  Deep tendon reflex/nerve stretch intact.  Sensation normal.  Cranial nerves II-XII intact.   Skin:   normal overall no scars, lesions, ulcers or rashes. No psoriasis.  Psychiatric: Alert and oriented x 3.  Recent memory intact, remote memory unclear.  Normal mood and affect. Well groomed.  Good eye contact.  Cardiovascular: overall no swelling, no varicosities, no edema bilaterally, normal temperatures of the legs and arms, no clubbing, cyanosis and good capillary refill.  Lymphatic: palpation is normal.  The right lower extremity is examined:  Inspection:  Thigh:  Non-tender and no defects  Knee has swelling 1+ effusion.                        Joint tenderness is present                        Patient is tender over the medial joint line  Lower Leg:  Has normal appearance and no tenderness or defects  Ankle:  Non-tender and no defects  Foot:  Non-tender and no defects Range of Motion:  Knee:  Range of motion is: 0-110                        Crepitus is  present  Ankle:  Range of motion is normal. Strength and Tone:  The right lower extremity has normal strength and tone. Stability:  Knee:  The  knee is stable.  Ankle:  The ankle is stable.    The patient has been educated about the nature of the problem(s) and counseled on treatment options.  The patient appeared to understand what I have discussed and is in agreement with it.  Encounter Diagnosis  Name Primary?  . Right knee pain Yes    PLAN Call if any problems.  Precautions discussed.  Continue current medications.   Return to clinic 3 months   Electronically Signed Darreld McleanWayne Carlisha Wisler, MD 6/8/20179:57 AM

## 2016-05-23 NOTE — Patient Instructions (Signed)
Continue medications

## 2016-06-25 ENCOUNTER — Telehealth: Payer: Self-pay

## 2016-06-25 MED ORDER — HYDROCODONE-ACETAMINOPHEN 5-325 MG PO TABS: 1.0000 | ORAL_TABLET | ORAL | Status: DC | PRN

## 2016-06-25 NOTE — Telephone Encounter (Signed)
Rx done. 

## 2016-06-25 NOTE — Telephone Encounter (Signed)
Pt requesting refill on hydrocodone.

## 2016-07-24 ENCOUNTER — Telehealth: Payer: Self-pay | Admitting: Orthopaedic Surgery

## 2016-07-24 MED ORDER — HYDROCODONE-ACETAMINOPHEN 5-325 MG PO TABS: 1.0000 | ORAL_TABLET | ORAL | 0 refills | Status: DC | PRN

## 2016-07-24 NOTE — Telephone Encounter (Signed)
Patient called and requested a refill of Hydrocodone/Acetaminophen 5-325 mgs.  Qty 120  Sig: Take 1 tablet by mouth every 4 (four) hours as needed for moderate pain (Must last 30 days. Do not take and drive a car or use machinery.).

## 2016-08-27 ENCOUNTER — Encounter: Payer: Self-pay | Admitting: Orthopaedic Surgery

## 2016-08-27 ENCOUNTER — Ambulatory Visit (INDEPENDENT_AMBULATORY_CARE_PROVIDER_SITE_OTHER): Payer: Self-pay | Admitting: Orthopaedic Surgery

## 2016-08-27 VITALS — BP 133/76 | HR 107 | Ht 68.0 in | Wt 203.4 lb

## 2016-08-27 DIAGNOSIS — M25561 Pain in right knee: Secondary | ICD-10-CM

## 2016-08-27 MED ORDER — HYDROCODONE-ACETAMINOPHEN 5-325 MG PO TABS: 1.0000 | ORAL_TABLET | Freq: Four times a day (QID) | ORAL | 0 refills | Status: DC | PRN

## 2016-08-27 NOTE — Progress Notes (Signed)
Patient VW:UJWJXBJYN:Amy Andersen, female DOB:15-Nov-1976, 40 y.o. WGN:[id number]  Chief Complaint  Patient presents with  . Follow-up    right knee pain    HPI  Amy Andersen is a 40 y.o. female who has right knee pain that is stable and chronic. She has swelling and popping but no giving way, no new trauma. HPI  Body mass index is 30.93 kg/m.  ROS  Review of Systems  HENT: Negative for congestion.   Respiratory: Negative for cough and shortness of breath.   Cardiovascular: Negative for chest pain and leg swelling.  Endocrine: Positive for cold intolerance.  Musculoskeletal: Positive for arthralgias, gait problem and joint swelling.  Allergic/Immunologic: Positive for environmental allergies.    No past medical history on file.  No past surgical history on file.  No family history on file.  Social History Social History  Substance Use Topics  . Smoking status: Current Every Day Smoker    Types: Cigarettes  . Smokeless tobacco: Never Used  . Alcohol use No    No Known Allergies  Current Outpatient Prescriptions  Medication Sig Dispense Refill  . HYDROcodone-acetaminophen (NORCO/VICODIN) 5-325 MG tablet Take 1 tablet by mouth every 6 (six) hours as needed for moderate pain (Must last 30 days.Do not take and drive a car or use machinery.). 110 tablet 0  . naproxen (NAPROSYN) 500 MG tablet Take 1 tablet (500 mg total) by mouth 2 (two) times daily with a meal. 60 tablet 5   No current facility-administered medications for this visit.      Physical Exam  Blood pressure 133/76, pulse (!) 107, height 5\' 8"  (1.727 m), weight 203 lb 6.4 oz (92.3 kg), last menstrual period 08/16/2016.  Constitutional: overall normal hygiene, normal nutrition, well developed, normal grooming, normal body habitus. Assistive device:none  Musculoskeletal: gait and station Limp right, muscle tone and strength are normal, no tremors or atrophy is present.  .  Neurological: coordination  overall normal.  Deep tendon reflex/nerve stretch intact.  Sensation normal.  Cranial nerves II-XII intact.   Skin:   Normal overall no scars, lesions, ulcers or rashes. No psoriasis.  Psychiatric: Alert and oriented x 3.  Recent memory intact, remote memory unclear.  Normal mood and affect. Well groomed.  Good eye contact.  Cardiovascular: overall no swelling, no varicosities, no edema bilaterally, normal temperatures of the legs and arms, no clubbing, cyanosis and good capillary refill.  Lymphatic: palpation is normal.  The left and right lower extremity is examined:  Inspection:  Thigh:  Non-tender and no defects  Knee has swelling 1+ effusion.                        Joint tenderness is present                        Patient is tender over the medial joint line  Lower Leg:  Has normal appearance and no tenderness or defects  Ankle:  Non-tender and no defects  Foot:  Non-tender and no defects Range of Motion:  Knee:  Range of motion is: 0-110                        Crepitus is  present  Ankle:  Range of motion is normal. Strength and Tone:  The right lower extremity has normal strength and tone. Stability:  Knee:  The knee is stable.  Ankle:  The ankle is stable.  The patient has been educated about the nature of the problem(s) and counseled on treatment options.  The patient appeared to understand what I have discussed and is in agreement with it.  Encounter Diagnosis  Name Primary?  . Right knee pain Yes    PLAN Call if any problems.  Precautions discussed.  Continue current medications.   Return to clinic 3 months   Electronically Signed Darreld Mclean, MD 9/12/20178:53 AM

## 2016-08-27 NOTE — Patient Instructions (Signed)
Smoking Cessation, Tips for Success If you are ready to quit smoking, congratulations! You have chosen to help yourself be healthier. Cigarettes bring nicotine, tar, carbon monoxide, and other irritants into your body. Your lungs, heart, and blood vessels will be able to work better without these poisons. There are many different ways to quit smoking. Nicotine gum, nicotine patches, a nicotine inhaler, or nicotine nasal spray can help with physical craving. Hypnosis, support groups, and medicines help break the habit of smoking. WHAT THINGS CAN I DO TO MAKE QUITTING EASIER?  Here are some tips to help you quit for good:  Pick a date when you will quit smoking completely. Tell all of your friends and family about your plan to quit on that date.  Do not try to slowly cut down on the number of cigarettes you are smoking. Pick a quit date and quit smoking completely starting on that day.  Throw away all cigarettes.   Clean and remove all ashtrays from your home, work, and car.  On a card, write down your reasons for quitting. Carry the card with you and read it when you get the urge to smoke.  Cleanse your body of nicotine. Drink enough water and fluids to keep your urine clear or pale yellow. Do this after quitting to flush the nicotine from your body.  Learn to predict your moods. Do not let a bad situation be your excuse to have a cigarette. Some situations in your life might tempt you into wanting a cigarette.  Never have "just one" cigarette. It leads to wanting another and another. Remind yourself of your decision to quit.  Change habits associated with smoking. If you smoked while driving or when feeling stressed, try other activities to replace smoking. Stand up when drinking your coffee. Brush your teeth after eating. Sit in a different chair when you read the paper. Avoid alcohol while trying to quit, and try to drink fewer caffeinated beverages. Alcohol and caffeine may urge you to  smoke.  Avoid foods and drinks that can trigger a desire to smoke, such as sugary or spicy foods and alcohol.  Ask people who smoke not to smoke around you.  Have something planned to do right after eating or having a cup of coffee. For example, plan to take a walk or exercise.  Try a relaxation exercise to calm you down and decrease your stress. Remember, you may be tense and nervous for the first 2 weeks after you quit, but this will pass.  Find new activities to keep your hands busy. Play with a pen, coin, or rubber band. Doodle or draw things on paper.  Brush your teeth right after eating. This will help cut down on the craving for the taste of tobacco after meals. You can also try mouthwash.   Use oral substitutes in place of cigarettes. Try using lemon drops, carrots, cinnamon sticks, or chewing gum. Keep them handy so they are available when you have the urge to smoke.  When you have the urge to smoke, try deep breathing.  Designate your home as a nonsmoking area.  If you are a heavy smoker, ask your health care provider about a prescription for nicotine chewing gum. It can ease your withdrawal from nicotine.  Reward yourself. Set aside the cigarette money you save and buy yourself something nice.  Look for support from others. Join a support group or smoking cessation program. Ask someone at home or at work to help you with your plan   to quit smoking.  Always ask yourself, "Do I need this cigarette or is this just a reflex?" Tell yourself, "Today, I choose not to smoke," or "I do not want to smoke." You are reminding yourself of your decision to quit.  Do not replace cigarette smoking with electronic cigarettes (commonly called e-cigarettes). The safety of e-cigarettes is unknown, and some may contain harmful chemicals.  If you relapse, do not give up! Plan ahead and think about what you will do the next time you get the urge to smoke. HOW WILL I FEEL WHEN I QUIT SMOKING? You  may have symptoms of withdrawal because your body is used to nicotine (the addictive substance in cigarettes). You may crave cigarettes, be irritable, feel very hungry, cough often, get headaches, or have difficulty concentrating. The withdrawal symptoms are only temporary. They are strongest when you first quit but will go away within 10-14 days. When withdrawal symptoms occur, stay in control. Think about your reasons for quitting. Remind yourself that these are signs that your body is healing and getting used to being without cigarettes. Remember that withdrawal symptoms are easier to treat than the major diseases that smoking can cause.  Even after the withdrawal is over, expect periodic urges to smoke. However, these cravings are generally short lived and will go away whether you smoke or not. Do not smoke! WHAT RESOURCES ARE AVAILABLE TO HELP ME QUIT SMOKING? Your health care provider can direct you to community resources or hospitals for support, which may include:  Group support.  Education.  Hypnosis.  Therapy.   This information is not intended to replace advice given to you by your health care provider. Make sure you discuss any questions you have with your health care provider.   Document Released: 08/30/2004 Document Revised: 12/23/2014 Document Reviewed: 05/20/2013 Elsevier Interactive Patient Education 2016 [employer]  Generic Knee Exercises EXERCISES RANGE OF MOTION (ROM) AND STRETCHING EXERCISES These exercises may help you when beginning to rehabilitate your injury. Your symptoms may resolve with or without further involvement from your physician, physical therapist, or athletic trainer. While completing these exercises, remember:   Restoring tissue flexibility helps normal motion to return to the joints. This allows healthier, less painful movement and activity.  An effective stretch should be held for at least 30 seconds.  A stretch should never be painful. You  should only feel a gentle lengthening or release in the stretched tissue. STRETCH - Knee Extension, Prone  Lie on your stomach on a firm surface, such as a bed or countertop. Place your right / left knee and leg just beyond the edge of the surface. You may wish to place a towel under the far end of your right / left thigh for comfort.  Relax your leg muscles and allow gravity to straighten your knee. Your clinician may advise you to add an ankle weight if more resistance is helpful for you.  You should feel a stretch in the back of your right / left knee. Hold this position for __________ seconds. Repeat __________ times. Complete this stretch __________ times per day. * Your physician, physical therapist, or athletic trainer may ask you to add ankle weight to enhance your stretch.  RANGE OF MOTION - Knee Flexion, Active  Lie on your back with both knees straight. (If this causes back discomfort, bend your opposite knee, placing your foot flat on the floor.)  Slowly slide your heel back toward your buttocks until you feel a gentle stretch in the   front of your knee or thigh.  Hold for __________ seconds. Slowly slide your heel back to the starting position. Repeat __________ times. Complete this exercise __________ times per day.  STRETCH - Quadriceps, Prone   Lie on your stomach on a firm surface, such as a bed or padded floor.  Bend your right / left knee and grasp your ankle. If you are unable to reach your ankle or pant leg, use a belt around your foot to lengthen your reach.  Gently pull your heel toward your buttocks. Your knee should not slide out to the side. You should feel a stretch in the front of your thigh and/or knee.  Hold this position for __________ seconds. Repeat __________ times. Complete this stretch __________ times per day.  STRETCH - Hamstrings, Supine   Lie on your back. Loop a belt or towel over the ball of your right / left foot.  Straighten your right / left  knee and slowly pull on the belt to raise your leg. Do not allow the right / left knee to bend. Keep your opposite leg flat on the floor.  Raise the leg until you feel a gentle stretch behind your right / left knee or thigh. Hold this position for __________ seconds. Repeat __________ times. Complete this stretch __________ times per day.  STRENGTHENING EXERCISES These exercises may help you when beginning to rehabilitate your injury. They may resolve your symptoms with or without further involvement from your physician, physical therapist, or athletic trainer. While completing these exercises, remember:   Muscles can gain both the endurance and the strength needed for everyday activities through controlled exercises.  Complete these exercises as instructed by your physician, physical therapist, or athletic trainer. Progress the resistance and repetitions only as guided.  You may experience muscle soreness or fatigue, but the pain or discomfort you are trying to eliminate should never worsen during these exercises. If this pain does worsen, stop and make certain you are following the directions exactly. If the pain is still present after adjustments, discontinue the exercise until you can discuss the trouble with your clinician. STRENGTH - Quadriceps, Isometrics  Lie on your back with your right / left leg extended and your opposite knee bent.  Gradually tense the muscles in the front of your right / left thigh. You should see either your knee cap slide up toward your hip or increased dimpling just above the knee. This motion will push the back of the knee down toward the floor/mat/bed on which you are lying.  Hold the muscle as tight as you can without increasing your pain for __________ seconds.  Relax the muscles slowly and completely in between each repetition. Repeat __________ times. Complete this exercise __________ times per day.  STRENGTH - Quadriceps, Short Arcs   Lie on your back.  Place a __________ inch towel roll under your knee so that the knee slightly bends.  Raise only your lower leg by tightening the muscles in the front of your thigh. Do not allow your thigh to rise.  Hold this position for __________ seconds. Repeat __________ times. Complete this exercise __________ times per day.  OPTIONAL ANKLE WEIGHTS: Begin with ____________________, but DO NOT exceed ____________________. Increase in 1 pound/0.5 kilogram increments.  STRENGTH - Quadriceps, Straight Leg Raises  Quality counts! Watch for signs that the quadriceps muscle is working to insure you are strengthening the correct muscles and not "cheating" by substituting with healthier muscles.  Lay on your back with your right /   left leg extended and your opposite knee bent.  Tense the muscles in the front of your right / left thigh. You should see either your knee cap slide up or increased dimpling just above the knee. Your thigh may even quiver.  Tighten these muscles even more and raise your leg 4 to 6 inches off the floor. Hold for __________ seconds.  Keeping these muscles tense, lower your leg.  Relax the muscles slowly and completely in between each repetition. Repeat __________ times. Complete this exercise __________ times per day.  STRENGTH - Hamstring, Curls  Lay on your stomach with your legs extended. (If you lay on a bed, your feet may hang over the edge.)  Tighten the muscles in the back of your thigh to bend your right / left knee up to 90 degrees. Keep your hips flat on the bed/floor.  Hold this position for __________ seconds.  Slowly lower your leg back to the starting position. Repeat __________ times. Complete this exercise __________ times per day.  OPTIONAL ANKLE WEIGHTS: Begin with ____________________, but DO NOT exceed ____________________. Increase in 1 pound/0.5 kilogram increments.  STRENGTH - Quadriceps, Squats  Stand in a door frame so that your feet and knees are in  line with the frame.  Use your hands for balance, not support, on the frame.  Slowly lower your weight, bending at the hips and knees. Keep your lower legs upright so that they are parallel with the door frame. Squat only within the range that does not increase your knee pain. Never let your hips drop below your knees.  Slowly return upright, pushing with your legs, not pulling with your hands. Repeat __________ times. Complete this exercise __________ times per day.  STRENGTH - Quadriceps, Wall Slides  Follow guidelines for form closely. Increased knee pain often results from poorly placed feet or knees.  Lean against a smooth wall or door and walk your feet out 18-24 inches. Place your feet hip-width apart.  Slowly slide down the wall or door until your knees bend __________ degrees.* Keep your knees over your heels, not your toes, and in line with your hips, not falling to either side.  Hold for __________ seconds. Stand up to rest for __________ seconds in between each repetition. Repeat __________ times. Complete this exercise __________ times per day. * Your physician, physical therapist, or athletic trainer will alter this angle based on your symptoms and progress.   This information is not intended to replace advice given to you by your health care provider. Make sure you discuss any questions you have with your health care provider.   Document Released: 10/16/2005 Document Revised: 12/23/2014 Document Reviewed: 03/16/2009 Elsevier Interactive Patient Education 2016 [employer]  

## 2016-09-25 ENCOUNTER — Telehealth: Payer: Self-pay | Admitting: Orthopaedic Surgery

## 2016-09-25 MED ORDER — HYDROCODONE-ACETAMINOPHEN 5-325 MG PO TABS: 1.0000 | ORAL_TABLET | Freq: Four times a day (QID) | ORAL | 0 refills | Status: DC | PRN

## 2016-09-25 MED ORDER — NAPROXEN 500 MG PO TABS: 500.0000 mg | ORAL_TABLET | Freq: Two times a day (BID) | ORAL | 5 refills | Status: DC

## 2016-09-25 NOTE — Telephone Encounter (Signed)
Patient requests refill 2 medications:   HYDROcodone-acetaminophen (NORCO/VICODIN) 5-325 MG tablet 110 tablet   naproxen (NAPROSYN) 500 MG tablet 60 tablets/refills 5   - patient states has no insurance

## 2016-10-23 ENCOUNTER — Telehealth: Payer: Self-pay | Admitting: Orthopaedic Surgery

## 2016-10-23 MED ORDER — HYDROCODONE-ACETAMINOPHEN 5-325 MG PO TABS: 1.0000 | ORAL_TABLET | Freq: Four times a day (QID) | ORAL | 0 refills | Status: DC | PRN

## 2016-10-23 NOTE — Telephone Encounter (Signed)
Patient called for refill:HYDROcodone-acetaminophen (NORCO/VICODIN) 5-325 MG tablet  - self-pay/no insurance

## 2016-11-26 ENCOUNTER — Encounter: Payer: Self-pay | Admitting: Orthopaedic Surgery

## 2016-11-26 ENCOUNTER — Ambulatory Visit (INDEPENDENT_AMBULATORY_CARE_PROVIDER_SITE_OTHER): Payer: Self-pay | Admitting: Orthopaedic Surgery

## 2016-11-26 VITALS — BP 141/81 | HR 104 | Temp 97.9°F | Ht 68.0 in | Wt 202.0 lb

## 2016-11-26 DIAGNOSIS — M25561 Pain in right knee: Secondary | ICD-10-CM

## 2016-11-26 DIAGNOSIS — G8929 Other chronic pain: Secondary | ICD-10-CM

## 2016-11-26 MED ORDER — HYDROCODONE-ACETAMINOPHEN 5-325 MG PO TABS: 1.0000 | ORAL_TABLET | Freq: Four times a day (QID) | ORAL | 0 refills | Status: DC | PRN

## 2016-11-26 NOTE — Patient Instructions (Signed)
Steps to Quit Smoking Smoking tobacco can be bad for your health. It can also affect almost every organ in your body. Smoking puts you and people around you at risk for many serious long-lasting (chronic) diseases. Quitting smoking is hard, but it is one of the best things that you can do for your health. It is never too late to quit. What are the benefits of quitting smoking? When you quit smoking, you lower your risk for getting serious diseases and conditions. They can include:  Lung cancer or lung disease.  Heart disease.  Stroke.  Heart attack.  Not being able to have children (infertility).  Weak bones (osteoporosis) and broken bones (fractures). If you have coughing, wheezing, and shortness of breath, those symptoms may get better when you quit. You may also get sick less often. If you are pregnant, quitting smoking can help to lower your chances of having a baby of low birth weight. What can I do to help me quit smoking? Talk with your doctor about what can help you quit smoking. Some things you can do (strategies) include:  Quitting smoking totally, instead of slowly cutting back how much you smoke over a period of time.  Going to in-person counseling. You are more likely to quit if you go to many counseling sessions.  Using resources and support systems, such as:  Online chats with a counselor.  Phone quitlines.  Printed self-help materials.  Support groups or group counseling.  Text messaging programs.  Mobile phone apps or applications.  Taking medicines. Some of these medicines may have nicotine in them. If you are pregnant or breastfeeding, do not take any medicines to quit smoking unless your doctor says it is okay. Talk with your doctor about counseling or other things that can help you. Talk with your doctor about using more than one strategy at the same time, such as taking medicines while you are also going to in-person counseling. This can help make quitting  easier. What things can I do to make it easier to quit? Quitting smoking might feel very hard at first, but there is a lot that you can do to make it easier. Take these steps:  Talk to your family and friends. Ask them to support and encourage you.  Call phone quitlines, reach out to support groups, or work with a counselor.  Ask people who smoke to not smoke around you.  Avoid places that make you want (trigger) to smoke, such as:  Bars.  Parties.  Smoke-break areas at work.  Spend time with people who do not smoke.  Lower the stress in your life. Stress can make you want to smoke. Try these things to help your stress:  Getting regular exercise.  Deep-breathing exercises.  Yoga.  Meditating.  Doing a body scan. To do this, close your eyes, focus on one area of your body at a time from head to toe, and notice which parts of your body are tense. Try to relax the muscles in those areas.  Download or buy apps on your mobile phone or tablet that can help you stick to your quit plan. There are many free apps, such as QuitGuide from the CDC (Centers for Disease Control and Prevention). You can find more support from smokefree.gov and other websites. This information is not intended to replace advice given to you by your health care provider. Make sure you discuss any questions you have with your health care provider. Document Released: 09/28/2009 Document Revised: 07/30/2016 Document   Reviewed: 04/18/2015 Elsevier Interactive Patient Education  2017 Elsevier Inc.  

## 2016-11-26 NOTE — Progress Notes (Signed)
Patient ZO:XWRUEAVWU Amy Andersen, female DOB:[birth year], 40 y.o. JWJ:[id number]  Chief Complaint  Patient presents with  . Knee Pain    Chronic right    HPI  Amy Andersen is a 39 y.o. female who has chronic pain of the right knee. She has no locking, no giving way. She has swelling and popping. She is taking her medicine. She has no new trauma. HPI  Body mass index is 30.71 kg/m.  ROS  Review of Systems  HENT: Negative for congestion.   Respiratory: Negative for cough and shortness of breath.   Cardiovascular: Negative for chest pain and leg swelling.  Endocrine: Positive for cold intolerance.  Musculoskeletal: Positive for arthralgias, gait problem and joint swelling.  Allergic/Immunologic: Positive for environmental allergies.    No past medical history on file.  No past surgical history on file.  No family history on file.  Social History Social History  Substance Use Topics  . Smoking status: Current Every Day Smoker    Types: Cigarettes  . Smokeless tobacco: Never Used  . Alcohol use No    No Known Allergies  Current Outpatient Prescriptions  Medication Sig Dispense Refill  . HYDROcodone-acetaminophen (NORCO/VICODIN) 5-325 MG tablet Take 1 tablet by mouth every 6 (six) hours as needed for moderate pain (Must last 30 days.Do not take and drive a car or use machinery.). 90 tablet 0  . naproxen (NAPROSYN) 500 MG tablet Take 1 tablet (500 mg total) by mouth 2 (two) times daily with a meal. 60 tablet 5   No current facility-administered medications for this visit.      Physical Exam  Blood pressure (!) 141/81, pulse (!) 104, temperature 97.9 F (36.6 C), height  (1.727 m), weight 202 lb (91.6 kg), last menstrual period 11/15/2016.  Constitutional: overall normal hygiene, normal nutrition, well developed, normal grooming, normal body habitus. Assistive device:none  Musculoskeletal: gait and station Limp right, muscle tone and strength are normal,  no tremors or atrophy is present.  .  Neurological: coordination overall normal.  Deep tendon reflex/nerve stretch intact.  Sensation normal.  Cranial nerves II-XII intact.   Skin:   Normal overall no scars, lesions, ulcers or rashes. No psoriasis.  Psychiatric: Alert and oriented x 3.  Recent memory intact, remote memory unclear.  Normal mood and affect. Well groomed.  Good eye contact.  Cardiovascular: overall no swelling, no varicosities, no edema bilaterally, normal temperatures of the legs and arms, no clubbing, cyanosis and good capillary refill.  Lymphatic: palpation is normal.   The right lower extremity is examined:  Inspection:  Thigh:  Non-tender and no defects  Knee has swelling 1+ effusion.                        Joint tenderness is present                        Patient is tender over the medial joint line  Lower Leg:  Has normal appearance and no tenderness or defects  Ankle:  Non-tender and no defects  Foot:  Non-tender and no defects Range of Motion:  Knee:  Range of motion is: 0-110                        Crepitus is  present  Ankle:  Range of motion is normal. Strength and Tone:  The right lower extremity has normal strength and tone. Stability:  Knee:  The knee is stable.  Ankle:  The ankle is stable.   The patient has been educated about the nature of the problem(s) and counseled on treatment options.  The patient appeared to understand what I have discussed and is in agreement with it.  Encounter Diagnosis  Name Primary?  . Chronic pain of right knee Yes    PLAN Call if any problems.  Precautions discussed.  Continue current medications.   Return to clinic 3 months   Electronically Signed Amy McleanWayne Kayah Hecker, MD 12/12/20178:32 AM

## 2016-12-24 ENCOUNTER — Telehealth: Payer: Self-pay | Admitting: Orthopaedic Surgery

## 2016-12-24 MED ORDER — HYDROCODONE-ACETAMINOPHEN 5-325 MG PO TABS: 1.0000 | ORAL_TABLET | Freq: Four times a day (QID) | ORAL | 0 refills | Status: DC | PRN

## 2016-12-25 ENCOUNTER — Telehealth: Payer: Self-pay | Admitting: Orthopaedic Surgery

## 2016-12-31 MED ORDER — HYDROCODONE-ACETAMINOPHEN 5-325 MG PO TABS: 1.0000 | ORAL_TABLET | Freq: Four times a day (QID) | ORAL | 0 refills | Status: DC | PRN

## 2017-01-22 ENCOUNTER — Telehealth: Payer: Self-pay | Admitting: Orthopaedic Surgery

## 2017-01-22 MED ORDER — HYDROCODONE-ACETAMINOPHEN 5-325 MG PO TABS: 1.0000 | ORAL_TABLET | Freq: Four times a day (QID) | ORAL | 0 refills | Status: DC | PRN

## 2017-02-25 ENCOUNTER — Ambulatory Visit: Payer: Self-pay | Admitting: Orthopaedic Surgery

## 2017-02-27 ENCOUNTER — Ambulatory Visit (INDEPENDENT_AMBULATORY_CARE_PROVIDER_SITE_OTHER): Payer: Self-pay | Admitting: Orthopaedic Surgery

## 2017-02-27 ENCOUNTER — Encounter: Payer: Self-pay | Admitting: Orthopaedic Surgery

## 2017-02-27 VITALS — BP 123/74 | HR 117 | Ht 68.0 in | Wt 193.0 lb

## 2017-02-27 DIAGNOSIS — G8929 Other chronic pain: Secondary | ICD-10-CM

## 2017-02-27 DIAGNOSIS — M25561 Pain in right knee: Secondary | ICD-10-CM

## 2017-02-27 MED ORDER — HYDROCODONE-ACETAMINOPHEN 5-325 MG PO TABS: 1.0000 | ORAL_TABLET | Freq: Four times a day (QID) | ORAL | 0 refills | Status: DC | PRN

## 2017-02-27 NOTE — Progress Notes (Signed)
Patient ZO:XWRUEAVWU:Amy Andersen, female DOB:31-Mar-1976, 41 y.o. JWJ:[id number]  Chief Complaint  Patient presents with  . Follow-up    CHRONIC RIGHT KNEE PAIN    HPI  Amy Andersen is a 41 y.o. female who has chronic knee pain on the right.  She has been stable. She has swelling and popping but no giving way, no locking.  She is active and taking her medicine. HPI  Body mass index is 29.35 kg/m.  ROS  Review of Systems  HENT: Negative for congestion.   Respiratory: Negative for cough and shortness of breath.   Cardiovascular: Negative for chest pain and leg swelling.  Endocrine: Positive for cold intolerance.  Musculoskeletal: Positive for arthralgias, gait problem and joint swelling.  Allergic/Immunologic: Positive for environmental allergies.    No past medical history on file.  No past surgical history on file.  No family history on file.  Social History Social History  Substance Use Topics  . Smoking status: Current Every Day Smoker    Types: Cigarettes  . Smokeless tobacco: Never Used  . Alcohol use No    No Known Allergies  Current Outpatient Prescriptions  Medication Sig Dispense Refill  . HYDROcodone-acetaminophen (NORCO/VICODIN) 5-325 MG tablet Take 1 tablet by mouth every 6 (six) hours as needed for moderate pain (Must last 30 days.Do not take and drive a car or use machinery.). 45 tablet 0  . naproxen (NAPROSYN) 500 MG tablet Take 1 tablet (500 mg total) by mouth 2 (two) times daily with a meal. 60 tablet 5   No current facility-administered medications for this visit.      Physical Exam  Blood pressure 123/74, pulse (!) 117, height 5\' 8"  (1.727 m), weight 193 lb (87.5 kg).  Constitutional: overall normal hygiene, normal nutrition, well developed, normal grooming, normal body habitus. Assistive device:none  Musculoskeletal: gait and station Limp right, muscle tone and strength are normal, no tremors or atrophy is present.  .  Neurological:  coordination overall normal.  Deep tendon reflex/nerve stretch intact.  Sensation normal.  Cranial nerves II-XII intact.   Skin:   Normal overall no scars, lesions, ulcers or rashes. No psoriasis.  Psychiatric: Alert and oriented x 3.  Recent memory intact, remote memory unclear.  Normal mood and affect. Well groomed.  Good eye contact.  Cardiovascular: overall no swelling, no varicosities, no edema bilaterally, normal temperatures of the legs and arms, no clubbing, cyanosis and good capillary refill.  Lymphatic: palpation is normal.  The right lower extremity is examined:  Inspection:  Thigh:  Non-tender and no defects  Knee has swelling 1+ effusion.                        Joint tenderness is present                        Patient is tender over the medial joint line  Lower Leg:  Has normal appearance and no tenderness or defects  Ankle:  Non-tender and no defects  Foot:  Non-tender and no defects Range of Motion:  Knee:  Range of motion is: 0-105                        Crepitus is  present  Ankle:  Range of motion is normal. Strength and Tone:  The right lower extremity has normal strength and tone. Stability:  Knee:  The knee is stable.  Ankle:  The  ankle is stable.    The patient has been educated about the nature of the problem(s) and counseled on treatment options.  The patient appeared to understand what I have discussed and is in agreement with it.  Encounter Diagnosis  Name Primary?  . Chronic pain of right knee Yes    PLAN Call if any problems.  Precautions discussed.  Continue current medications.   Return to clinic 3 months   I have reviewed the [facility] Controlled Substance Reporting System web site prior to prescribing narcotic medicine for this patient.  Electronically Signed Darreld Mclean, MD 3/15/201811:13 AM

## 2017-03-25 ENCOUNTER — Telehealth: Payer: Self-pay | Admitting: Orthopaedic Surgery

## 2017-03-25 MED ORDER — HYDROCODONE-ACETAMINOPHEN 5-325 MG PO TABS: 1.0000 | ORAL_TABLET | Freq: Four times a day (QID) | ORAL | 0 refills | Status: DC | PRN

## 2017-04-22 ENCOUNTER — Telehealth: Payer: Self-pay | Admitting: Orthopaedic Surgery

## 2017-04-22 MED ORDER — HYDROCODONE-ACETAMINOPHEN 5-325 MG PO TABS: 1.0000 | ORAL_TABLET | Freq: Four times a day (QID) | ORAL | 0 refills | Status: DC | PRN

## 2017-05-28 ENCOUNTER — Ambulatory Visit (INDEPENDENT_AMBULATORY_CARE_PROVIDER_SITE_OTHER): Payer: Self-pay | Admitting: Orthopaedic Surgery

## 2017-05-28 ENCOUNTER — Encounter: Payer: Self-pay | Admitting: Orthopaedic Surgery

## 2017-05-28 VITALS — BP 146/71 | HR 93 | Temp 98.1°F | Ht 68.0 in | Wt 192.0 lb

## 2017-05-28 DIAGNOSIS — M25561 Pain in right knee: Secondary | ICD-10-CM

## 2017-05-28 DIAGNOSIS — G8929 Other chronic pain: Secondary | ICD-10-CM

## 2017-05-28 MED ORDER — HYDROCODONE-ACETAMINOPHEN 5-325 MG PO TABS: 1.0000 | ORAL_TABLET | Freq: Four times a day (QID) | ORAL | 0 refills | Status: DC | PRN

## 2017-05-28 NOTE — Patient Instructions (Signed)
Steps to Quit Smoking Smoking tobacco can be bad for your health. It can also affect almost every organ in your body. Smoking puts you and people around you at risk for many serious long-lasting (chronic) diseases. Quitting smoking is hard, but it is one of the best things that you can do for your health. It is never too late to quit. What are the benefits of quitting smoking? When you quit smoking, you lower your risk for getting serious diseases and conditions. They can include:  Lung cancer or lung disease.  Heart disease.  Stroke.  Heart attack.  Not being able to have children (infertility).  Weak bones (osteoporosis) and broken bones (fractures).  If you have coughing, wheezing, and shortness of breath, those symptoms may get better when you quit. You may also get sick less often. If you are pregnant, quitting smoking can help to lower your chances of having a baby of low birth weight. What can I do to help me quit smoking? Talk with your doctor about what can help you quit smoking. Some things you can do (strategies) include:  Quitting smoking totally, instead of slowly cutting back how much you smoke over a period of time.  Going to in-person counseling. You are more likely to quit if you go to many counseling sessions.  Using resources and support systems, such as: ? Online chats with a counselor. ? Phone quitlines. ? Printed self-help materials. ? Support groups or group counseling. ? Text messaging programs. ? Mobile phone apps or applications.  Taking medicines. Some of these medicines may have nicotine in them. If you are pregnant or breastfeeding, do not take any medicines to quit smoking unless your doctor says it is okay. Talk with your doctor about counseling or other things that can help you.  Talk with your doctor about using more than one strategy at the same time, such as taking medicines while you are also going to in-person counseling. This can help make  quitting easier. What things can I do to make it easier to quit? Quitting smoking might feel very hard at first, but there is a lot that you can do to make it easier. Take these steps:  Talk to your family and friends. Ask them to support and encourage you.  Call phone quitlines, reach out to support groups, or work with a counselor.  Ask people who smoke to not smoke around you.  Avoid places that make you want (trigger) to smoke, such as: ? Bars. ? Parties. ? Smoke-break areas at work.  Spend time with people who do not smoke.  Lower the stress in your life. Stress can make you want to smoke. Try these things to help your stress: ? Getting regular exercise. ? Deep-breathing exercises. ? Yoga. ? Meditating. ? Doing a body scan. To do this, close your eyes, focus on one area of your body at a time from head to toe, and notice which parts of your body are tense. Try to relax the muscles in those areas.  Download or buy apps on your mobile phone or tablet that can help you stick to your quit plan. There are many free apps, such as QuitGuide from the CDC (Centers for Disease Control and Prevention). You can find more support from smokefree.gov and other websites.  This information is not intended to replace advice given to you by your health care provider. Make sure you discuss any questions you have with your health care provider. Document Released: 09/28/2009 Document   Revised: 07/30/2016 Document Reviewed: 04/18/2015 Elsevier Interactive Patient Education  2018 Elsevier Inc.  

## 2017-05-28 NOTE — Progress Notes (Signed)
Patient ZO:XWRUEAVWU:Amy Andersen, female DOB:05-30-1976, 41 y.o. JWJ:[id number]  Chief Complaint  Patient presents with  . Follow-up    Chronic Right Knee Pain    HPI  Amy Andersen is a 41 y.o. female who has chronic pain of the right knee.  She has swelling, giving way and popping.  She has pain at rest at times.  She needs a MRI but does not have insurance.  She is careful in walking and going down stairs. HPI  Body mass index is 29.19 kg/m.  ROS  Review of Systems  HENT: Negative for congestion.   Respiratory: Negative for cough and shortness of breath.   Cardiovascular: Negative for chest pain and leg swelling.  Endocrine: Positive for cold intolerance.  Musculoskeletal: Positive for arthralgias, gait problem and joint swelling.  Allergic/Immunologic: Positive for environmental allergies.    History reviewed. No pertinent past medical history.  History reviewed. No pertinent surgical history.  History reviewed. No pertinent family history.  Social History Social History  Substance Use Topics  . Smoking status: Current Every Day Smoker    Types: Cigarettes  . Smokeless tobacco: Never Used  . Alcohol use No    No Known Allergies  Current Outpatient Prescriptions  Medication Sig Dispense Refill  . HYDROcodone-acetaminophen (NORCO/VICODIN) 5-325 MG tablet Take 1 tablet by mouth every 6 (six) hours as needed for moderate pain (Must last 30 days.Do not take and drive a car or use machinery.). 20 tablet 0  . naproxen (NAPROSYN) 500 MG tablet TAKE ONE TABLET BY MOUTH TWICE DAILY AFTER MEALS. 60 tablet 5   No current facility-administered medications for this visit.      Physical Exam  Blood pressure (!) 146/71, pulse 93, temperature 98.1 F (36.7 C), height 5\' 8"  (1.727 m), weight 192 lb (87.1 kg).  Constitutional: overall normal hygiene, normal nutrition, well developed, normal grooming, normal body habitus. Assistive device:none  Musculoskeletal: gait  and station Limp right, muscle tone and strength are normal, no tremors or atrophy is present.  .  Neurological: coordination overall normal.  Deep tendon reflex/nerve stretch intact.  Sensation normal.  Cranial nerves II-XII intact.   Skin:   Normal overall no scars, lesions, ulcers or rashes. No psoriasis.  Psychiatric: Alert and oriented x 3.  Recent memory intact, remote memory unclear.  Normal mood and affect. Well groomed.  Good eye contact.  Cardiovascular: overall no swelling, no varicosities, no edema bilaterally, normal temperatures of the legs and arms, no clubbing, cyanosis and good capillary refill.  Lymphatic: palpation is normal.  The right lower extremity is examined:  Inspection:  Thigh:  Non-tender and no defects  Knee has swelling 1+ effusion.                        Joint tenderness is present                        Patient is tender over the medial joint line  Lower Leg:  Has normal appearance and no tenderness or defects  Ankle:  Non-tender and no defects  Foot:  Non-tender and no defects Range of Motion:  Knee:  Range of motion is: 0-105                        Crepitus is  present  Ankle:  Range of motion is normal. Strength and Tone:  The right lower extremity has normal strength and  tone. Stability:  Knee:  The knee is stable.  Ankle:  The ankle is stable.     The patient has been educated about the nature of the problem(s) and counseled on treatment options.  The patient appeared to understand what I have discussed and is in agreement with it.  Encounter Diagnosis  Name Primary?  . Chronic pain of right knee Yes    PLAN Call if any problems.  Precautions discussed.  Continue current medications.   Return to clinic 3 months   I have reviewed the [facility] Controlled Substance Reporting System web site prior to prescribing narcotic medicine for this patient.  She smokes and will try to cut back.  Electronically Signed Darreld Mclean,  MD 6/13/20188:46 AM

## 2017-06-25 ENCOUNTER — Other Ambulatory Visit: Payer: Self-pay | Admitting: Orthopaedic Surgery

## 2017-09-09 ENCOUNTER — Telehealth: Payer: Self-pay | Admitting: Orthopaedic Surgery

## 2018-01-15 ENCOUNTER — Other Ambulatory Visit: Payer: Self-pay | Admitting: Orthopaedic Surgery

## 2018-01-28 ENCOUNTER — Encounter: Payer: Self-pay | Admitting: *Deleted

## 2018-02-03 ENCOUNTER — Other Ambulatory Visit: Payer: Self-pay | Admitting: Adult Health

## 2018-06-19 ENCOUNTER — Inpatient Hospital Stay (HOSPITAL_COMMUNITY): Payer: Self-pay | Attending: Hematology | Admitting: Hematology

## 2018-06-19 ENCOUNTER — Encounter (HOSPITAL_COMMUNITY): Payer: Self-pay | Admitting: Hematology

## 2018-06-19 VITALS — BP 153/69 | HR 100 | Temp 98.0°F | Resp 16 | Ht 67.0 in | Wt 195.3 lb

## 2018-06-19 DIAGNOSIS — M94261 Chondromalacia, right knee: Secondary | ICD-10-CM | POA: Insufficient documentation

## 2018-06-19 DIAGNOSIS — E538 Deficiency of other specified B group vitamins: Secondary | ICD-10-CM | POA: Insufficient documentation

## 2018-06-19 DIAGNOSIS — F5089 Other specified eating disorder: Secondary | ICD-10-CM | POA: Insufficient documentation

## 2018-06-19 DIAGNOSIS — G8929 Other chronic pain: Secondary | ICD-10-CM | POA: Insufficient documentation

## 2018-06-19 DIAGNOSIS — D509 Iron deficiency anemia, unspecified: Secondary | ICD-10-CM | POA: Insufficient documentation

## 2018-06-19 DIAGNOSIS — F17210 Nicotine dependence, cigarettes, uncomplicated: Secondary | ICD-10-CM | POA: Insufficient documentation

## 2018-06-19 DIAGNOSIS — Z79899 Other long term (current) drug therapy: Secondary | ICD-10-CM | POA: Insufficient documentation

## 2018-06-19 DIAGNOSIS — D500 Iron deficiency anemia secondary to blood loss (chronic): Secondary | ICD-10-CM

## 2018-06-19 DIAGNOSIS — K5900 Constipation, unspecified: Secondary | ICD-10-CM | POA: Insufficient documentation

## 2018-06-19 DIAGNOSIS — Z809 Family history of malignant neoplasm, unspecified: Secondary | ICD-10-CM | POA: Insufficient documentation

## 2018-06-19 DIAGNOSIS — R5383 Other fatigue: Secondary | ICD-10-CM | POA: Insufficient documentation

## 2018-06-19 NOTE — Patient Instructions (Signed)
[facility] Discharge Instructions  You saw Dr. Katragadda today.   Thank you for choosing [facility] at [facility] to provide your oncology and hematology care.  To afford each patient quality time with our provider, please arrive at least 15 minutes before your scheduled appointment time.   If you have a lab appointment with the Cancer Center please come in thru the  Main Entrance and check in at the main information desk  You need to re-schedule your appointment should you arrive 10 or more minutes late.  We strive to give you quality time with our providers, and arriving late affects you and other patients whose appointments are after yours.  Also, if you no show three or more times for appointments you may be dismissed from the clinic at the providers discretion.     Again, thank you for choosing [facility].  Our hope is that these requests will decrease the amount of time that you wait before being seen by our physicians.       _____________________________________________________________  Should you have questions after your visit to  Cancer Center, please contact our office at [phone] between the hours of 8:30 a.m. and 4:30 p.m.  Voicemails left after 4:30 p.m. will not be returned until the following business day.  For prescription refill requests, have your pharmacy contact our office.       Resources For Cancer Patients and their Caregivers ? American Cancer Society: Can assist with transportation, wigs, general needs, runs Look Good Feel Better.        1-888-227-6333 ? Cancer Care: Provides financial assistance, online support groups, medication/co-pay assistance.  1-800-813-HOPE (4673) ? [facility] Assists [place] cancer patients and their families through emotional , educational and financial support.  336-427-4357 ? [place] DSS Where to apply for  food stamps, Medicaid and utility assistance. 336-342-1394 ? [facility]: Transportation to medical appointments. 336-347-2287 ? Social Security Administration: May apply for disability if have a Stage IV cancer. 336-342-7796 1-800-772-1213 ? [place] [facility]ging, Disability and Transit Services: Assists with nutrition, care and transit needs. 336-349-2343  Cancer Center Support Programs:   > Cancer Support Group  2nd Tuesday of the month 1pm-2pm, Journey Room   > Creative Journey  3rd Tuesday of the month 1130am-1pm, Journey Room     

## 2018-06-19 NOTE — Progress Notes (Signed)
CONSULT NOTE  Patient Care Team: Elfredia Nevins, MD as PCP - General (Internal Medicine)  CHIEF COMPLAINTS/PURPOSE OF CONSULTATION:  Iron deficiency anemia.  HISTORY OF PRESENTING ILLNESS:  Amy Andersen 42 y.o. female is seen in consultation today for further management of iron deficiency anemia.  Her CBC on 02/27/2018 shows hemoglobin of 6.5 with an MCV of 70.  She was reportedly started on iron tablet daily.  Hemoglobin improved to 8.9 on 03/27/2018.  However recent ferritin was low at 5 on 06/04/2018.  Her B12 was also found to be 233.  She has occasional numbness in the hands when she wakes up.  She does have occasional constipation since she started iron tablets.  She is actively menstruating and her bleeding lasts for 5 to 7 days, light flow, every 28 days.  She complains of feeling very tired.  No improvement in tiredness since she started taking iron pill.  She does report ice pica.  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  She works as a Engineer, structural.  She has smoked for 20 years, half to 1 pack/day.  Denies any alcohol use.  No family history of anemias or malignancies.  Denies any fevers, night sweats or weight loss.  Denies any history of blood transfusions.   MEDICAL HISTORY:  Past Medical History:  Diagnosis Date  . Chronic pain     SURGICAL HISTORY: Past Surgical History:  Procedure Laterality Date  . DILATION AND CURETTAGE OF UTERUS      SOCIAL HISTORY: Social History   Socioeconomic History  . Marital status: Married    Spouse name: Tasia Catchings  . Number of children: 3  . Years of education: 9  . Highest education level: 9th grade  Occupational History  . Not on file  Social Needs  . Financial resource strain: Somewhat hard  . Food insecurity:    Worry: Often true    Inability: Often true  . Transportation needs:    Medical: No    Non-medical: No  Tobacco Use  . Smoking status: Current Every Day  Smoker    Packs/day: 0.50    Types: Cigarettes  . Smokeless tobacco: Never Used  Substance and Sexual Activity  . Alcohol use: No  . Drug use: No  . Sexual activity: Yes    Birth control/protection: None  Lifestyle  . Physical activity:    Days per week: 0 days    Minutes per session: Not on file  . Stress: Rather much  Relationships  . Social connections:    Talks on phone: More than three times a week    Gets together: Twice a week    Attends religious service: Never    Active member of club or organization: No    Attends meetings of clubs or organizations: Never    Relationship status: Married  . Intimate partner violence:    Fear of current or ex partner: No    Emotionally abused: No    Physically abused: No    Forced sexual activity: No  Other Topics Concern  . Not on file  Social History Narrative  . Not on file    FAMILY HISTORY: Family History  Problem Relation Age of Onset  . Hypertension Maternal Grandmother   . CAD Maternal Grandmother   . Cancer Maternal Grandfather   . Hypertension Mother     ALLERGIES:  has No Known Allergies.  MEDICATIONS:  Current Outpatient Medications  Medication  Sig Dispense Refill  . HYDROcodone-acetaminophen (NORCO/VICODIN) 5-325 MG tablet Take 1 tablet by mouth every 6 (six) hours as needed for moderate pain (Must last 30 days.Do not take and drive a car or use machinery.). 20 tablet 0  . naproxen (NAPROSYN) 500 MG tablet TAKE ONE TABLET BY MOUTH TWICE DAILY AFTER MEALS. 60 tablet 5   No current facility-administered medications for this visit.     REVIEW OF SYSTEMS:   Constitutional: Denies fevers, chills or abnormal night sweats.  Severe fatigue. Eyes: Denies blurriness of vision, double vision or watery eyes Ears, nose, mouth, throat, and face: Denies mucositis or sore throat Respiratory: Denies cough, dyspnea or wheezes Cardiovascular: Denies palpitation, chest discomfort or lower extremity  swelling Gastrointestinal:  Denies nausea, heartburn or change in bowel habits Skin: Denies abnormal skin rashes Lymphatics: Denies new lymphadenopathy or easy bruising Neurological:Denies numbness, tingling or new weaknesses Behavioral/Psych: Mood is stable, no new changes  All other systems were reviewed with the patient and are negative.  PHYSICAL EXAMINATION: ECOG PERFORMANCE STATUS: 0 - Asymptomatic  Vitals:   06/19/18 1302  BP: (!) 153/69  Pulse: 100  Resp: 16  Temp: 98 F (36.7 C)  SpO2: 100%   Filed Weights   06/19/18 1302  Weight: 195 lb 4.8 oz (88.6 kg)    GENERAL:alert, no distress and comfortable SKIN: skin color, texture, turgor are normal, no rashes or significant lesions EYES: normal, conjunctiva are pink and non-injected, sclera clear OROPHARYNX:no exudate, no erythema and lips, buccal mucosa, and tongue normal  NECK: supple, thyroid normal size, non-tender, without nodularity LYMPH:  no palpable lymphadenopathy in the cervical, axillary or inguinal LUNGS: clear to auscultation and percussion with normal breathing effort HEART: regular rate & rhythm and no murmurs and no lower extremity edema ABDOMEN:abdomen soft, non-tender and normal bowel sounds PSYCH: alert & oriented x 3 with fluent speech   LABORATORY DATA:  I have reviewed the data as listed No results found for this or any previous visit (from the past 2160 hour(s)).  RADIOGRAPHIC STUDIES: I have personally reviewed the radiological images as listed and agreed with the findings in the report. No results found.  ASSESSMENT & PLAN:  Iron deficiency anemia due to chronic blood loss 1.  Severe iron deficiency anemia: - She was found to be severely anemic with a hemoglobin of 6.8 in March 2019.  She was started on oral iron therapy.  Her hemoglobin on 03/27/2018 improved to 8.9. - Her most recent blood work reviewed by me on 06/04/2018 shows ferritin of 5, TIBC of 505, vitamin B12 of 233.  Folic acid  was normal. - She is actively menstruating, with 5 to 7 days of light flow every 28 days.  Denies any rectal bleeding or hematuria. -She reports feeling very tired and has not not noticed any improvement since she started on oral iron therapy. -I have recommended parenteral iron therapy with Feraheme weekly x2.  We talked about the side effects including rare chance of anaphylactic reaction in detail.  She understands and gives us permission to proceed with the treatment.  We will schedule it starting next week.  I plan to see her back 3 to 4 weeks after her last Feraheme infusion.  I will repeat blood work at that time.  2.  Vitamin B12 deficiency: -Her vitamin B12 was borderline low at 233.  We will give her 1 injection of vitamin B12.  I have also suggested her to start taking B12 1 mg tablet daily.  All questions were answered. The patient knows to call the clinic with any problems, questions or concerns.     Doreatha Massed, MD 06/19/18 2:30 PM

## 2018-06-19 NOTE — Assessment & Plan Note (Signed)
1.  Severe iron deficiency anemia: - She was found to be severely anemic with a hemoglobin of 6.8 in March 2019.  She was started on oral iron therapy.  Her hemoglobin on 03/27/2018 improved to 8.9. - Her most recent blood work reviewed by me on 06/04/2018 shows ferritin of 5, TIBC of 505, vitamin B12 of 233.  Folic acid was normal. - She is actively menstruating, with 5 to 7 days of light flow every 28 days.  Denies any rectal bleeding or hematuria. -She reports feeling very tired and has not not noticed any improvement since she started on oral iron therapy. -I have recommended parenteral iron therapy with Feraheme weekly x2.  We talked about the side effects including rare chance of anaphylactic reaction in detail.  She understands and gives us permission to proceed with the treatment.  We will schedule it starting next week.  I plan to see her back 3 to 4 weeks after her last Feraheme infusion.  I will repeat blood work at that time.  2.  Vitamin B12 deficiency: -Her vitamin B12 was borderline low at 233.  We will give her 1 injection of vitamin B12.  I have also suggested her to start taking B12 1 mg tablet daily.

## 2018-06-23 ENCOUNTER — Ambulatory Visit (HOSPITAL_COMMUNITY): Payer: Medicaid Other

## 2018-06-25 ENCOUNTER — Encounter (HOSPITAL_COMMUNITY): Payer: Self-pay

## 2018-06-25 ENCOUNTER — Inpatient Hospital Stay (HOSPITAL_COMMUNITY): Payer: Self-pay

## 2018-06-25 VITALS — BP 169/68 | HR 116 | Temp 97.7°F | Resp 18

## 2018-06-25 DIAGNOSIS — D500 Iron deficiency anemia secondary to blood loss (chronic): Secondary | ICD-10-CM

## 2018-06-25 MED ORDER — SODIUM CHLORIDE 0.9 % IV SOLN
510.0000 mg | Freq: Once | INTRAVENOUS | Status: AC
  Administered 2018-06-25: 510 mg via INTRAVENOUS
  Filled 2018-06-25: qty 17

## 2018-06-25 MED ORDER — SODIUM CHLORIDE 0.9 % IV SOLN
Freq: Once | INTRAVENOUS | Status: AC
  Administered 2018-06-25: 10:00:00 via INTRAVENOUS

## 2018-06-25 NOTE — Patient Instructions (Signed)
[facility] at [facility] Discharge Instructions  Received Feraheme infusion today. Follow-up as scheduled. Call clinic for any questions or concerns   Thank you for choosing [facility] at [facility] to provide your oncology and hematology care.  To afford each patient quality time with our provider, please arrive at least 15 minutes before your scheduled appointment time.   If you have a lab appointment with the Cancer Center please come in thru the  Main Entrance and check in at the main information desk  You need to re-schedule your appointment should you arrive 10 or more minutes late.  We strive to give you quality time with our providers, and arriving late affects you and other patients whose appointments are after yours.  Also, if you no show three or more times for appointments you may be dismissed from the clinic at the providers discretion.     Again, thank you for choosing [facility].  Our hope is that these requests will decrease the amount of time that you wait before being seen by our physicians.       _____________________________________________________________  Should you have questions after your visit to [facility], please contact our office at [phone] between the hours of 8:30 a.m. and 4:30 p.m.  Voicemails left after 4:30 p.m. will not be returned until the following business day.  For prescription refill requests, have your pharmacy contact our office.       Resources For Cancer Patients and their Caregivers ? American Cancer Society: Can assist with transportation, wigs, general needs, runs Look Good Feel Better.        1-888-227-6333 ? Cancer Care: Provides financial assistance, online support groups, medication/co-pay assistance.  1-800-813-HOPE (4673) ? [facility] Assists [place] cancer patients and their families through emotional , educational and  financial support.  336-427-4357 ? [place] DSS Where to apply for food stamps, Medicaid and utility assistance. 336-342-1394 ? [facility]: Transportation to medical appointments. 336-347-2287 ? Social Security Administration: May apply for disability if have a Stage IV cancer. 336-342-7796 1-800-772-1213 ? [place] Aging, Disability and Transit Services: Assists with nutrition, care and transit needs. 336-349-2343  Cancer Center Support Programs:   > Cancer Support Group  2nd Tuesday of the month 1pm-2pm, Journey Room   > Creative Journey  3rd Tuesday of the month 1130am-1pm, Journey Room    

## 2018-06-25 NOTE — Progress Notes (Signed)
Amy Andersen tolerated Feraheme infusion well without complaints or incident.Peripheral IV site with positive blood return prior to, during and after infusion. VSS upon discharge. Pt discharged self ambulatory in satisfactory condition accompanied by her husband

## 2018-06-30 ENCOUNTER — Ambulatory Visit (HOSPITAL_COMMUNITY): Payer: Medicaid Other

## 2018-07-03 ENCOUNTER — Encounter (HOSPITAL_COMMUNITY): Payer: Self-pay

## 2018-07-03 ENCOUNTER — Inpatient Hospital Stay (HOSPITAL_COMMUNITY): Payer: Self-pay

## 2018-07-03 VITALS — BP 112/59 | HR 86 | Temp 98.0°F | Resp 18

## 2018-07-03 DIAGNOSIS — D500 Iron deficiency anemia secondary to blood loss (chronic): Secondary | ICD-10-CM

## 2018-07-03 MED ORDER — SODIUM CHLORIDE 0.9 % IV SOLN
Freq: Once | INTRAVENOUS | Status: AC
  Administered 2018-07-03: 11:00:00 via INTRAVENOUS

## 2018-07-03 MED ORDER — SODIUM CHLORIDE 0.9 % IV SOLN
510.0000 mg | Freq: Once | INTRAVENOUS | Status: AC
  Administered 2018-07-03: 510 mg via INTRAVENOUS
  Filled 2018-07-03: qty 17

## 2018-07-03 NOTE — Progress Notes (Signed)
Amy Andersen tolerated Feraheme infusion well without complaints or incident. VSS upon discharge. Peripheral IV with positive blood return prior to and after infusion.Pt discharged self ambulatory in satisfactory condition accompanied by her husband

## 2018-07-03 NOTE — Patient Instructions (Signed)
[facility] at [facility] Discharge Instructions  Received Feraheme infusion today. Follow-up as scheduled. Call clinic for any questions or concerns   Thank you for choosing [facility] at [facility] to provide your oncology and hematology care.  To afford each patient quality time with our provider, please arrive at least 15 minutes before your scheduled appointment time.   If you have a lab appointment with the Cancer Center please come in thru the  Main Entrance and check in at the main information desk  You need to re-schedule your appointment should you arrive 10 or more minutes late.  We strive to give you quality time with our providers, and arriving late affects you and other patients whose appointments are after yours.  Also, if you no show three or more times for appointments you may be dismissed from the clinic at the providers discretion.     Again, thank you for choosing [facility].  Our hope is that these requests will decrease the amount of time that you wait before being seen by our physicians.       _____________________________________________________________  Should you have questions after your visit to  Cancer Center, please contact our office at [phone] between the hours of 8:30 a.m. and 4:30 p.m.  Voicemails left after 4:30 p.m. will not be returned until the following business day.  For prescription refill requests, have your pharmacy contact our office.       Resources For Cancer Patients and their Caregivers ? American Cancer Society: Can assist with transportation, wigs, general needs, runs Look Good Feel Better.        1-888-227-6333 ? Cancer Care: Provides financial assistance, online support groups, medication/co-pay assistance.  1-800-813-HOPE (4673) ? [facility] Assists [place] cancer patients and their families through emotional , educational and  financial support.  336-427-4357 ? [place] DSS Where to apply for food stamps, Medicaid and utility assistance. 336-342-1394 ? [facility]: Transportation to medical appointments. 336-347-2287 ? Social Security Administration: May apply for disability if have a Stage IV cancer. 336-342-7796 1-800-772-1213 ? [place] Aging, Disability and Transit Services: Assists with nutrition, care and transit needs. 336-349-2343  Cancer Center Support Programs:   > Cancer Support Group  2nd Tuesday of the month 1pm-2pm, Journey Room   > Creative Journey  3rd Tuesday of the month 1130am-1pm, Journey Room    

## 2018-07-20 ENCOUNTER — Inpatient Hospital Stay (HOSPITAL_COMMUNITY): Payer: Self-pay | Attending: Hematology

## 2018-07-20 DIAGNOSIS — F17210 Nicotine dependence, cigarettes, uncomplicated: Secondary | ICD-10-CM | POA: Insufficient documentation

## 2018-07-20 DIAGNOSIS — G8929 Other chronic pain: Secondary | ICD-10-CM | POA: Insufficient documentation

## 2018-07-20 DIAGNOSIS — Z79899 Other long term (current) drug therapy: Secondary | ICD-10-CM | POA: Insufficient documentation

## 2018-07-20 DIAGNOSIS — D509 Iron deficiency anemia, unspecified: Secondary | ICD-10-CM | POA: Insufficient documentation

## 2018-07-20 DIAGNOSIS — R5383 Other fatigue: Secondary | ICD-10-CM | POA: Insufficient documentation

## 2018-07-20 DIAGNOSIS — E538 Deficiency of other specified B group vitamins: Secondary | ICD-10-CM | POA: Insufficient documentation

## 2018-07-20 DIAGNOSIS — D500 Iron deficiency anemia secondary to blood loss (chronic): Secondary | ICD-10-CM

## 2018-07-20 LAB — CBC WITH DIFFERENTIAL/PLATELET
BASOS ABS: 0.1 10*3/uL (ref 0.0–0.1)
Basophils Relative: 1 %
EOS PCT: 3 %
Eosinophils Absolute: 0.3 10*3/uL (ref 0.0–0.7)
HEMATOCRIT: 41.1 % (ref 36.0–46.0)
Hemoglobin: 12.1 g/dL (ref 12.0–15.0)
Lymphocytes Relative: 27 %
Lymphs Abs: 2.4 10*3/uL (ref 0.7–4.0)
MCH: 26.5 pg (ref 26.0–34.0)
MCHC: 29.4 g/dL — AB (ref 30.0–36.0)
MCV: 90.1 fL (ref 78.0–100.0)
MONO ABS: 0.5 10*3/uL (ref 0.1–1.0)
Monocytes Relative: 5 %
Neutro Abs: 5.6 10*3/uL (ref 1.7–7.7)
Neutrophils Relative %: 64 %
PLATELETS: 370 10*3/uL (ref 150–400)
RBC: 4.56 MIL/uL (ref 3.87–5.11)
WBC: 8.8 10*3/uL (ref 4.0–10.5)

## 2018-07-20 LAB — COMPREHENSIVE METABOLIC PANEL
ALBUMIN: 4.1 g/dL (ref 3.5–5.0)
ALT: 21 U/L (ref 0–44)
ANION GAP: 7 (ref 5–15)
AST: 21 U/L (ref 15–41)
Alkaline Phosphatase: 57 U/L (ref 38–126)
BILIRUBIN TOTAL: 0.7 mg/dL (ref 0.3–1.2)
BUN: 10 mg/dL (ref 6–20)
CHLORIDE: 102 mmol/L (ref 98–111)
CO2: 27 mmol/L (ref 22–32)
Calcium: 9.2 mg/dL (ref 8.9–10.3)
Creatinine, Ser: 0.8 mg/dL (ref 0.44–1.00)
GFR calc Af Amer: >60 mL/min (ref >60)
Glucose, Bld: 92 mg/dL (ref 70–99)
POTASSIUM: 4.5 mmol/L (ref 3.5–5.1)
Sodium: 136 mmol/L (ref 135–145)
TOTAL PROTEIN: 7.7 g/dL (ref 6.5–8.1)

## 2018-07-20 LAB — IRON AND TIBC
Iron: 105 ug/dL (ref 28–170)
SATURATION RATIOS: 28 % (ref 10.4–31.8)
TIBC: 371 ug/dL (ref 250–450)
UIBC: 266 ug/dL

## 2018-07-20 LAB — VITAMIN B12: Vitamin B-12: 514 pg/mL (ref 180–914)

## 2018-07-20 LAB — FERRITIN: FERRITIN: 67 ng/mL (ref 11–307)

## 2018-07-22 ENCOUNTER — Inpatient Hospital Stay (HOSPITAL_BASED_OUTPATIENT_CLINIC_OR_DEPARTMENT_OTHER): Payer: Self-pay | Admitting: Hematology

## 2018-07-22 ENCOUNTER — Encounter (HOSPITAL_COMMUNITY): Payer: Self-pay | Admitting: Hematology

## 2018-07-22 VITALS — BP 135/75 | HR 95 | Temp 98.3°F | Resp 16 | Wt 193.8 lb

## 2018-07-22 DIAGNOSIS — D500 Iron deficiency anemia secondary to blood loss (chronic): Secondary | ICD-10-CM

## 2018-07-22 DIAGNOSIS — E538 Deficiency of other specified B group vitamins: Secondary | ICD-10-CM

## 2018-07-22 DIAGNOSIS — R5383 Other fatigue: Secondary | ICD-10-CM

## 2018-07-22 DIAGNOSIS — G8929 Other chronic pain: Secondary | ICD-10-CM

## 2018-07-22 DIAGNOSIS — Z79899 Other long term (current) drug therapy: Secondary | ICD-10-CM

## 2018-07-22 DIAGNOSIS — F17210 Nicotine dependence, cigarettes, uncomplicated: Secondary | ICD-10-CM

## 2018-07-22 DIAGNOSIS — D509 Iron deficiency anemia, unspecified: Secondary | ICD-10-CM

## 2018-07-22 NOTE — Patient Instructions (Addendum)
[facility] at [facility] Discharge Instructions  You saw Dr. Ellin SabaKatragadda today. Please follow up with us in 4 months with labs a day prior. Iron infusion next week.    Thank you for choosing [facility] at [facility] to provide your oncology and hematology care.  To afford each patient quality time with our provider, please arrive at least 15 minutes before your scheduled appointment time.   If you have a lab appointment with the Cancer Center please come in thru the  Main Entrance and check in at the main information desk  You need to re-schedule your appointment should you arrive 10 or more minutes late.  We strive to give you quality time with our providers, and arriving late affects you and other patients whose appointments are after yours.  Also, if you no show three or more times for appointments you may be dismissed from the clinic at the providers discretion.     Again, thank you for choosing [facility].  Our hope is that these requests will decrease the amount of time that you wait before being seen by our physicians.       _____________________________________________________________  Should you have questions after your visit to [facility], please contact our office at [phone] between the hours of 8:00 a.m. and 4:30 p.m.  Voicemails left after 4:00 p.m. will not be returned until the following business day.  For prescription refill requests, have your pharmacy contact our office and allow 72 hours.    Cancer Center Support Programs:   > Cancer Support Group  2nd Tuesday of the month 1pm-2pm, Journey Room

## 2018-07-22 NOTE — Assessment & Plan Note (Addendum)
1.  Severe iron deficiency anemia: - She was found to be severely anemic with a hemoglobin of 6.8 in March 2019.  She was started on oral iron therapy.  Her hemoglobin on 03/27/2018 improved to 8.9. - Her most recent blood work on 06/04/2018 showed ferritin of 5, TIBC of 505, vitamin B12 of 233.  Folic acid was normal. - She is actively menstruating, with 5 to 7 days of light flow every 28 days.  Denies any rectal bleeding or hematuria. - She received Feraheme on 06/25/2018 and 07/03/2018.  Her hemoglobin improved to 12.1.  Ferritin has improved to 67.  She still feels somewhat tired.  I have recommended one more infusion of Feraheme.  We will reevaluate her in 3 to 4 months with repeat labs.  2.  Vitamin B12 deficiency: -Her vitamin B12 was borderline low at 233.  She received 1 injection of B12 and was started on B12 tablet 1 mg daily.  Her repeat B12 has improved to 514.  She will continue oral B12 supplementation.

## 2018-07-22 NOTE — Progress Notes (Signed)
[facility] [address] [place], [place] [address]   CLINIC:  Medical Oncology/Hematology  PCP:  Elfredia NevinsFusco, Lawrence, MD [address] [place] [address] [phone]   REASON FOR VISIT:  Follow-up for iron deficiency anemia   CURRENT THERAPY: Intermittent iron infusion   INTERVAL HISTORY:  Amy Andersen 42 y.o. female returns for routine follow-up for iron deficiency anemia. Patient is doing well since her last visit no issues with iron infusions. Patient is still fatigued throughout the day. Reports all she wants to do is sleep all day. She has numbness in her knee from an old injury which is stable at this time. Patient lives at home and performs her own ADLs and activity. Her appetite is 50% however she is maintaining her weight. Patient denies any bleeding or easy bruising. Denies any new pains. Denies any fevers or recent infections.    REVIEW OF SYSTEMS:  Review of Systems  Constitutional: Positive for fatigue.  HENT:  Negative.   Eyes: Negative.   Respiratory: Negative.   Cardiovascular: Negative.   Gastrointestinal: Negative.   Endocrine: Negative.   Genitourinary: Negative.    Skin: Negative.   Neurological: Positive for dizziness, extremity weakness and numbness.  Hematological: Negative.   Psychiatric/Behavioral: Negative.      PAST MEDICAL/SURGICAL HISTORY:  Past Medical History:  Diagnosis Date  . Chronic pain    Past Surgical History:  Procedure Laterality Date  . DILATION AND CURETTAGE OF UTERUS       SOCIAL HISTORY:  Social History   Socioeconomic History  . Marital status: Married    Spouse name: Tasia CatchingsCraig  . Number of children: 3  . Years of education: 9  . Highest education level: 9th grade  Occupational History  . Not on file  Social Needs  . Financial resource strain: Somewhat hard  . Food insecurity:    Worry: Often true    Inability: Often true  . Transportation needs:    Medical: No    Non-medical: No    Tobacco Use  . Smoking status: Current Every Day Smoker    Packs/day: 0.50    Types: Cigarettes  . Smokeless tobacco: Never Used  Substance and Sexual Activity  . Alcohol use: No  . Drug use: No  . Sexual activity: Yes    Birth control/protection: None  Lifestyle  . Physical activity:    Days per week: 0 days    Minutes per session: Not on file  . Stress: Rather much  Relationships  . Social connections:    Talks on phone: More than three times a week    Gets together: Twice a week    Attends religious service: Never    Active member of club or organization: No    Attends meetings of clubs or organizations: Never    Relationship status: Married  . Intimate partner violence:    Fear of current or ex partner: No    Emotionally abused: No    Physically abused: No    Forced sexual activity: No  Other Topics Concern  . Not on file  Social History Narrative  . Not on file    FAMILY HISTORY:  Family History  Problem Relation Age of Onset  . Hypertension Maternal Grandmother   . CAD Maternal Grandmother   . Cancer Maternal Grandfather   . Hypertension Mother     CURRENT MEDICATIONS:  Outpatient Encounter Medications as of 07/22/2018  Medication Sig  . HYDROcodone-acetaminophen (NORCO/VICODIN) 5-325 MG tablet Take 1  tablet by mouth every 6 (six) hours as needed for moderate pain (Must last 30 days.Do not take and drive a car or use machinery.).  Marland Kitchen naproxen (NAPROSYN) 500 MG tablet TAKE ONE TABLET BY MOUTH TWICE DAILY AFTER MEALS.   No facility-administered encounter medications on file as of 07/22/2018.     ALLERGIES:  No Known Allergies   PHYSICAL EXAM:  ECOG Performance status: 1  Vitals:   07/22/18 1458  BP: 135/75  Pulse: 95  Resp: 16  Temp: 98.3 F (36.8 C)  SpO2: 98%   Filed Weights   07/22/18 1458  Weight: 193 lb 12.8 oz (87.9 kg)    Physical Exam  Constitutional: She is oriented to person, place, and time.  Cardiovascular: Normal rate, regular  rhythm and normal heart sounds.  Pulmonary/Chest: Effort normal and breath sounds normal.  Neurological: She is alert and oriented to person, place, and time.  Skin: Skin is warm and dry.     LABORATORY DATA:  I have reviewed the labs as listed.  CBC    Component Value Date/Time   WBC 8.8 07/20/2018 1026   RBC 4.56 07/20/2018 1026   HGB 12.1 07/20/2018 1026   HCT 41.1 07/20/2018 1026   PLT 370 07/20/2018 1026   MCV 90.1 07/20/2018 1026   MCH 26.5 07/20/2018 1026   MCHC 29.4 (L) 07/20/2018 1026   LYMPHSABS 2.4 07/20/2018 1026   MONOABS 0.5 07/20/2018 1026   EOSABS 0.3 07/20/2018 1026   BASOSABS 0.1 07/20/2018 1026   CMP Latest Ref Rng & Units 07/20/2018  Glucose 70 - 99 mg/dL 92  BUN 6 - 20 mg/dL 10  Creatinine 1.61 - 0.96 mg/dL 0.45  Sodium 409 - 811 mmol/L 136  Potassium 3.5 - 5.1 mmol/L 4.5  Chloride 98 - 111 mmol/L 102  CO2 22 - 32 mmol/L 27  Calcium 8.9 - 10.3 mg/dL 9.2  Total Protein 6.5 - 8.1 g/dL 7.7  Total Bilirubin 0.3 - 1.2 mg/dL 0.7  Alkaline Phos 38 - 126 U/L 57  AST 15 - 41 U/L 21  ALT 0 - 44 U/L 21      ASSESSMENT & PLAN:   Iron deficiency anemia due to chronic blood loss 1.  Severe iron deficiency anemia: - She was found to be severely anemic with a hemoglobin of 6.8 in March 2019.  She was started on oral iron therapy.  Her hemoglobin on 03/27/2018 improved to 8.9. - Her most recent blood work on 06/04/2018 showed ferritin of 5, TIBC of 505, vitamin B12 of 233.  Folic acid was normal. - She is actively menstruating, with 5 to 7 days of light flow every 28 days.  Denies any rectal bleeding or hematuria. - She received Feraheme on 06/25/2018 and 07/03/2018.  Her hemoglobin improved to 12.1.  Ferritin has improved to 67.  She still feels somewhat tired.  I have recommended one more infusion of Feraheme.  We will reevaluate her in 3 to 4 months with repeat labs.  2.  Vitamin B12 deficiency: -Her vitamin B12 was borderline low at 233.  She received 1  injection of B12 and was started on B12 tablet 1 mg daily.  Her repeat B12 has improved to 514.  She will continue oral B12 supplementation.      Orders placed this encounter:  Orders Placed This Encounter  Procedures  . CBC with Differential/Platelet  . Comprehensive metabolic panel  . Ferritin  . Iron and TIBC  . Vitamin B12  Derek Jack, MD [place] [phone]

## 2018-07-31 ENCOUNTER — Other Ambulatory Visit: Payer: Self-pay

## 2018-07-31 ENCOUNTER — Other Ambulatory Visit: Payer: Self-pay | Admitting: Orthopaedic Surgery

## 2018-07-31 ENCOUNTER — Inpatient Hospital Stay (HOSPITAL_COMMUNITY): Payer: Self-pay

## 2018-07-31 ENCOUNTER — Encounter (HOSPITAL_COMMUNITY): Payer: Self-pay

## 2018-07-31 VITALS — BP 115/62 | HR 82 | Temp 97.5°F | Resp 18

## 2018-07-31 DIAGNOSIS — D500 Iron deficiency anemia secondary to blood loss (chronic): Secondary | ICD-10-CM

## 2018-07-31 MED ORDER — SODIUM CHLORIDE 0.9 % IV SOLN
510.0000 mg | Freq: Once | INTRAVENOUS | Status: AC
  Administered 2018-07-31: 510 mg via INTRAVENOUS
  Filled 2018-07-31: qty 17

## 2018-07-31 MED ORDER — SODIUM CHLORIDE 0.9 % IV SOLN
INTRAVENOUS | Status: DC
  Administered 2018-07-31: 14:00:00 via INTRAVENOUS

## 2018-07-31 NOTE — Progress Notes (Signed)
Tolerated infusion w/o adverse reaction.  Alert, in no distress.  VSS.  Discharged ambulatory in c/o spouse.  

## 2018-09-01 ENCOUNTER — Other Ambulatory Visit: Payer: Self-pay | Admitting: Orthopaedic Surgery

## 2018-11-23 ENCOUNTER — Inpatient Hospital Stay (HOSPITAL_COMMUNITY): Payer: Self-pay | Attending: Hematology

## 2018-11-23 DIAGNOSIS — F17210 Nicotine dependence, cigarettes, uncomplicated: Secondary | ICD-10-CM | POA: Insufficient documentation

## 2018-11-23 DIAGNOSIS — D500 Iron deficiency anemia secondary to blood loss (chronic): Secondary | ICD-10-CM | POA: Insufficient documentation

## 2018-11-23 DIAGNOSIS — Z79899 Other long term (current) drug therapy: Secondary | ICD-10-CM | POA: Insufficient documentation

## 2018-11-23 DIAGNOSIS — E538 Deficiency of other specified B group vitamins: Secondary | ICD-10-CM | POA: Insufficient documentation

## 2018-11-23 DIAGNOSIS — G8929 Other chronic pain: Secondary | ICD-10-CM | POA: Insufficient documentation

## 2018-11-23 DIAGNOSIS — Z809 Family history of malignant neoplasm, unspecified: Secondary | ICD-10-CM | POA: Insufficient documentation

## 2018-11-23 LAB — COMPREHENSIVE METABOLIC PANEL
ALBUMIN: 3.9 g/dL (ref 3.5–5.0)
ALK PHOS: 51 U/L (ref 38–126)
ALT: 14 U/L (ref 0–44)
AST: 15 U/L (ref 15–41)
Anion gap: 7 (ref 5–15)
BILIRUBIN TOTAL: 0.6 mg/dL (ref 0.3–1.2)
BUN: 9 mg/dL (ref 6–20)
CO2: 24 mmol/L (ref 22–32)
Calcium: 8.6 mg/dL — ABNORMAL LOW (ref 8.9–10.3)
Chloride: 105 mmol/L (ref 98–111)
Creatinine, Ser: 0.73 mg/dL (ref 0.44–1.00)
GFR calc Af Amer: >60 mL/min (ref >60)
GFR calc non Af Amer: >60 mL/min (ref >60)
GLUCOSE: 107 mg/dL — AB (ref 70–99)
Potassium: 3.8 mmol/L (ref 3.5–5.1)
Sodium: 136 mmol/L (ref 135–145)
Total Protein: 7.3 g/dL (ref 6.5–8.1)

## 2018-11-23 LAB — IRON AND TIBC
Iron: 14 ug/dL — ABNORMAL LOW (ref 28–170)
Saturation Ratios: 3 % — ABNORMAL LOW (ref 10.4–31.8)
TIBC: 404 ug/dL (ref 250–450)
UIBC: 390 ug/dL

## 2018-11-23 LAB — CBC WITH DIFFERENTIAL/PLATELET
Abs Immature Granulocytes: 0.04 10*3/uL (ref 0.00–0.07)
Basophils Absolute: 0.1 10*3/uL (ref 0.0–0.1)
Basophils Relative: 1 %
EOS PCT: 6 %
Eosinophils Absolute: 0.6 10*3/uL — ABNORMAL HIGH (ref 0.0–0.5)
HEMATOCRIT: 32.5 % — AB (ref 36.0–46.0)
HEMOGLOBIN: 9.8 g/dL — AB (ref 12.0–15.0)
IMMATURE GRANULOCYTES: 0 %
LYMPHS ABS: 2.5 10*3/uL (ref 0.7–4.0)
LYMPHS PCT: 28 %
MCH: 29.5 pg (ref 26.0–34.0)
MCHC: 30.2 g/dL (ref 30.0–36.0)
MCV: 97.9 fL (ref 80.0–100.0)
MONOS PCT: 6 %
Monocytes Absolute: 0.5 10*3/uL (ref 0.1–1.0)
Neutro Abs: 5.2 10*3/uL (ref 1.7–7.7)
Neutrophils Relative %: 59 %
Platelets: 340 10*3/uL (ref 150–400)
RBC: 3.32 MIL/uL — ABNORMAL LOW (ref 3.87–5.11)
RDW: 12.6 % (ref 11.5–15.5)
WBC: 8.9 10*3/uL (ref 4.0–10.5)
nRBC: 0 % (ref 0.0–0.2)

## 2018-11-23 LAB — VITAMIN B12: VITAMIN B 12: 181 pg/mL (ref 180–914)

## 2018-11-23 LAB — FERRITIN: Ferritin: 7 ng/mL — ABNORMAL LOW (ref 11–307)

## 2018-11-30 ENCOUNTER — Other Ambulatory Visit: Payer: Self-pay

## 2018-11-30 ENCOUNTER — Inpatient Hospital Stay (HOSPITAL_BASED_OUTPATIENT_CLINIC_OR_DEPARTMENT_OTHER): Payer: Self-pay | Admitting: Hematology

## 2018-11-30 ENCOUNTER — Encounter (HOSPITAL_COMMUNITY): Payer: Self-pay | Admitting: Hematology

## 2018-11-30 ENCOUNTER — Inpatient Hospital Stay (HOSPITAL_COMMUNITY): Payer: Self-pay

## 2018-11-30 DIAGNOSIS — D500 Iron deficiency anemia secondary to blood loss (chronic): Secondary | ICD-10-CM

## 2018-11-30 DIAGNOSIS — Z809 Family history of malignant neoplasm, unspecified: Secondary | ICD-10-CM

## 2018-11-30 DIAGNOSIS — D538 Other specified nutritional anemias: Secondary | ICD-10-CM

## 2018-11-30 DIAGNOSIS — Z79899 Other long term (current) drug therapy: Secondary | ICD-10-CM

## 2018-11-30 DIAGNOSIS — F17210 Nicotine dependence, cigarettes, uncomplicated: Secondary | ICD-10-CM

## 2018-11-30 DIAGNOSIS — G8929 Other chronic pain: Secondary | ICD-10-CM

## 2018-11-30 DIAGNOSIS — E538 Deficiency of other specified B group vitamins: Secondary | ICD-10-CM | POA: Insufficient documentation

## 2018-11-30 MED ORDER — CYANOCOBALAMIN 1000 MCG/ML IJ SOLN
1000.0000 ug | Freq: Once | INTRAMUSCULAR | Status: AC
  Administered 2018-11-30: 1000 ug via INTRAMUSCULAR

## 2018-11-30 NOTE — Progress Notes (Signed)
[facility] [address] [name], [name]   CLINIC:  Medical Oncology/Hematology  PCP:  Amy NevinsFusco, [place], MD [address] [place] [id number] [phone]   REASON FOR VISIT: Follow-up for iron deficiency anemia  CURRENT THERAPY: intermittent iron infusions   INTERVAL HISTORY:  Amy Andersen 42 y.o. female returns for routine follow-up for iron deficiency anemia. She is here today with her husband. She is feeling weak and fatigued throughout the day. She reports her head spins and she feels dizzy when she stands up too fast. She also has trouble sleeping. She denies any new pains. Denies any nausea, vomiting, or diarrhea. Denies any numbness or tingling in her hands or feet. Denies any fevers or recent infections. Denies any bleeding or easy bruising. She reports her appetite 75% and energy level at 50%. She has no problems maintaining her weight.     REVIEW OF SYSTEMS:  Review of Systems  HENT:   Positive for trouble swallowing.   Neurological: Positive for dizziness.  Psychiatric/Behavioral: Positive for sleep disturbance.  All other systems reviewed and are negative.    PAST MEDICAL/SURGICAL HISTORY:  Past Medical History:  Diagnosis Date  . Chronic pain    Past Surgical History:  Procedure Laterality Date  . DILATION AND CURETTAGE OF UTERUS       SOCIAL HISTORY:  Social History   Socioeconomic History  . Marital status: Married    Spouse name: Amy Andersen  . Number of children: 3  . Years of education: 9  . Highest education level: 9th grade  Occupational History  . Not on file  Social Needs  . Financial resource strain: Somewhat hard  . Food insecurity:    Worry: Often true    Inability: Often true  . Transportation needs:    Medical: No    Non-medical: No  Tobacco Use  . Smoking status: Current Every Day Smoker    Packs/day: 0.50    Types: Cigarettes  . Smokeless tobacco: Never Used  Substance and Sexual Activity  .  Alcohol use: No  . Drug use: No  . Sexual activity: Yes    Birth control/protection: None  Lifestyle  . Physical activity:    Days per week: 0 days    Minutes per session: Not on file  . Stress: Rather much  Relationships  . Social connections:    Talks on phone: More than three times a week    Gets together: Twice a week    Attends religious service: Never    Active member of club or organization: No    Attends meetings of clubs or organizations: Never    Relationship status: Married  . Intimate partner violence:    Fear of current or ex partner: No    Emotionally abused: No    Physically abused: No    Forced sexual activity: No  Other Topics Concern  . Not on file  Social History Narrative  . Not on file    FAMILY HISTORY:  Family History  Problem Relation Age of Onset  . Hypertension Maternal Grandmother   . CAD Maternal Grandmother   . Cancer Maternal Grandfather   . Hypertension Mother     CURRENT MEDICATIONS:  Outpatient Encounter Medications as of 11/30/2018  Medication Sig  . HYDROcodone-acetaminophen (NORCO/VICODIN) 5-325 MG tablet Take 1 tablet by mouth every 6 (six) hours as needed for moderate pain (Must last 30 days.Do not take and drive a car or use machinery.).  Marland Kitchen. naproxen (NAPROSYN)  500 MG tablet TAKE ONE TABLET BY MOUTH TWICE DAILY AFTER MEALS.   No facility-administered encounter medications on file as of 11/30/2018.     ALLERGIES:  No Known Allergies   PHYSICAL EXAM:  ECOG Performance status: 1  Vitals:   11/30/18 1044  BP: (!) 155/76  Pulse: 95  Resp: 16  Temp: 97.8 F (36.6 C)  SpO2: 100%   Filed Weights   11/30/18 1044  Weight: 197 lb (89.4 kg)    Physical Exam Constitutional:      Appearance: Normal appearance. She is normal weight.  Musculoskeletal: Normal range of motion.  Skin:    General: Skin is warm and dry.  Neurological:     Mental Status: She is alert and oriented to person, place, and time. Mental status is  at baseline.  Psychiatric:        Mood and Affect: Mood normal.        Behavior: Behavior normal.        Thought Content: Thought content normal.        Judgment: Judgment normal.      LABORATORY DATA:  I have reviewed the labs as listed.  CBC    Component Value Date/Time   WBC 8.9 11/23/2018 1220   RBC 3.32 (L) 11/23/2018 1220   HGB 9.8 (L) 11/23/2018 1220   HCT 32.5 (L) 11/23/2018 1220   PLT 340 11/23/2018 1220   MCV 97.9 11/23/2018 1220   MCH 29.5 11/23/2018 1220   MCHC 30.2 11/23/2018 1220   RDW 12.6 11/23/2018 1220   LYMPHSABS 2.5 11/23/2018 1220   MONOABS 0.5 11/23/2018 1220   EOSABS 0.6 (H) 11/23/2018 1220   BASOSABS 0.1 11/23/2018 1220   CMP Latest Ref Rng & Units 11/23/2018 07/20/2018  Glucose 70 - 99 mg/dL 161(W) 92  BUN 6 - 20 mg/dL 9 10  Creatinine 9.60 - 1.00 mg/dL 4.54 0.98  Sodium 119 - 145 mmol/L 136 136  Potassium 3.5 - 5.1 mmol/L 3.8 4.5  Chloride 98 - 111 mmol/L 105 102  CO2 22 - 32 mmol/L 24 27  Calcium 8.9 - 10.3 mg/dL 1.4(N) 9.2  Total Protein 6.5 - 8.1 g/dL 7.3 7.7  Total Bilirubin 0.3 - 1.2 mg/dL 0.6 0.7  Alkaline Phos 38 - 126 U/L 51 57  AST 15 - 41 U/L 15 21  ALT 0 - 44 U/L 14 21       DIAGNOSTIC IMAGING:  I have independently reviewed the scans and discussed with the patient.   I have reviewed Amy Bud, NP's note and agree with the documentation.  I personally performed a face-to-face visit, made revisions and my assessment and plan is as follows.    ASSESSMENT & PLAN:   Iron deficiency anemia due to chronic blood loss 1.  Severe iron deficiency anemia: - She was found to be severely anemic with a hemoglobin of 6.8 in March 2019.  She was started on oral iron therapy.  Her hemoglobin on 03/27/2018 improved to 8.9. - Her most recent blood work on 06/04/2018 showed ferritin of 5, TIBC of 505, vitamin B12 of 233.  Folic acid was normal. - She is actively menstruating, with 7/28 days.  She has heavy bleeding 2 days.  Denies any  rectal bleeding or hematuria. - Last Feraheme infusion was on 07/31/2018.  Prior to that she received 2 infusions on 7/11 and 07/03/2018. - We have reviewed her blood work today.  Hemoglobin dropped to 9.8 from 12.1.  Ferritin was 7 from 67 4  months ago. -I have recommended Feraheme weekly x2.  We will recheck her blood in 10 weeks.  She will require more close follow-ups.  2.  Vitamin B12 deficiency: -Her B12 was borderline low at 233.  She received 1 injection of B12 and was started on B12 1 mg tablet daily.  She is not missing any doses. -Her B12 level today has come down to 181.  She is likely not absorbing B12.  Hence we will start her on B12 injections monthly.      Orders placed this encounter:  Orders Placed This Encounter  Procedures  . CBC with Differential/Platelet  . Comprehensive metabolic panel  . Ferritin  . Iron and TIBC  . Vitamin B12  . Folate      Doreatha Massed, MD [facility] [phone]

## 2018-11-30 NOTE — Assessment & Plan Note (Signed)
1.  Severe iron deficiency anemia: - She was found to be severely anemic with a hemoglobin of 6.8 in March 2019.  She was started on oral iron therapy.  Her hemoglobin on 03/27/2018 improved to 8.9. - Her most recent blood work on 06/04/2018 showed ferritin of 5, TIBC of 505, vitamin B12 of 233.  Folic acid was normal. - She is actively menstruating, with 7/28 days.  She has heavy bleeding 2 days.  Denies any rectal bleeding or hematuria. - Last Feraheme infusion was on 07/31/2018.  Prior to that she received 2 infusions on 7/11 and 07/03/2018. - We have reviewed her blood work today.  Hemoglobin dropped to 9.8 from 12.1.  Ferritin was 7 from 67 4 months ago. -I have recommended Feraheme weekly x2.  We will recheck her blood in 10 weeks.  She will require more close follow-ups.  2.  Vitamin B12 deficiency: -Her B12 was borderline low at 233.  She received 1 injection of B12 and was started on B12 1 mg tablet daily.  She is not missing any doses. -Her B12 level today has come down to 181.  She is likely not absorbing B12.  Hence we will start her on B12 injections monthly.

## 2018-11-30 NOTE — Patient Instructions (Signed)
[facility] Discharge Instructions  Follow up in 10 weeks with labs.    Thank you for choosing [facility] to provide your oncology and hematology care.  To afford each patient quality time with our provider, please arrive at least 15 minutes before your scheduled appointment time.   If you have a lab appointment with the Cancer Center please come in thru the  Main Entrance and check in at the main information desk  You need to re-schedule your appointment should you arrive 10 or more minutes late.  We strive to give you quality time with our providers, and arriving late affects you and other patients whose appointments are after yours.  Also, if you no show three or more times for appointments you may be dismissed from the clinic at the providers discretion.     Again, thank you for choosing [facility].  Our hope is that these requests will decrease the amount of time that you wait before being seen by our physicians.       _____________________________________________________________  Should you have questions after your visit to [facility], please contact our office at [phone] between the hours of 8:00 a.m. and 4:30 p.m.  Voicemails left after 4:00 p.m. will not be returned until the following business day.  For prescription refill requests, have your pharmacy contact our office and allow 72 hours.    Cancer Center Support Programs:   > Cancer Support Group  2nd Tuesday of the month 1pm-2pm, Journey Room

## 2018-11-30 NOTE — Progress Notes (Signed)
Amy Andersen presents today for injection per the provider's orders.  B12 administration without incident; see MAR for injection details.  Patient tolerated procedure well and without incident.  No questions or complaints noted at this time.  Discharged ambulatory.

## 2018-12-02 ENCOUNTER — Other Ambulatory Visit (HOSPITAL_COMMUNITY): Payer: Self-pay | Admitting: Hematology

## 2018-12-03 ENCOUNTER — Encounter (HOSPITAL_COMMUNITY): Payer: Self-pay

## 2018-12-03 ENCOUNTER — Inpatient Hospital Stay (HOSPITAL_COMMUNITY): Payer: Self-pay

## 2018-12-03 VITALS — BP 134/75 | HR 74 | Temp 97.8°F | Resp 18

## 2018-12-03 DIAGNOSIS — D500 Iron deficiency anemia secondary to blood loss (chronic): Secondary | ICD-10-CM

## 2018-12-03 MED ORDER — SODIUM CHLORIDE 0.9 % IV SOLN
510.0000 mg | Freq: Once | INTRAVENOUS | Status: AC
  Administered 2018-12-03: 510 mg via INTRAVENOUS
  Filled 2018-12-03: qty 17

## 2018-12-03 MED ORDER — SODIUM CHLORIDE 0.9 % IV SOLN
INTRAVENOUS | Status: DC
  Administered 2018-12-03: 14:00:00 via INTRAVENOUS

## 2018-12-03 MED ORDER — CYANOCOBALAMIN 1000 MCG/ML IJ SOLN: 1000.0000 ug | INTRAMUSCULAR | Status: DC

## 2018-12-03 NOTE — Patient Instructions (Signed)
[facility] at [facility] Discharge Instructions  Received Feraheme infusion today. Follow-up as scheduled. Call clinic for any questions or concerns   Thank you for choosing [facility] at [facility] to provide your oncology and hematology care.  To afford each patient quality time with our provider, please arrive at least 15 minutes before your scheduled appointment time.   If you have a lab appointment with the Cancer Center please come in thru the  Main Entrance and check in at the main information desk  You need to re-schedule your appointment should you arrive 10 or more minutes late.  We strive to give you quality time with our providers, and arriving late affects you and other patients whose appointments are after yours.  Also, if you no show three or more times for appointments you may be dismissed from the clinic at the providers discretion.     Again, thank you for choosing [facility].  Our hope is that these requests will decrease the amount of time that you wait before being seen by our physicians.       _____________________________________________________________  Should you have questions after your visit to [facility], please contact our office at [phone] between the hours of 8:00 a.m. and 4:30 p.m.  Voicemails left after 4:00 p.m. will not be returned until the following business day.  For prescription refill requests, have your pharmacy contact our office and allow 72 hours.    Cancer Center Support Programs:   > Cancer Support Group  2nd Tuesday of the month 1pm-2pm, Journey Room   

## 2018-12-03 NOTE — Progress Notes (Signed)
Carilyn P Decamp tolerated Feraheme infusion well without complaints or incident. VSS upon discharge. Pt discharged self ambulatory in satisfactory condition accompanied by her husband

## 2018-12-14 ENCOUNTER — Encounter (HOSPITAL_COMMUNITY): Payer: Self-pay

## 2018-12-14 ENCOUNTER — Inpatient Hospital Stay (HOSPITAL_COMMUNITY): Payer: Self-pay

## 2018-12-14 VITALS — BP 116/61 | HR 74 | Temp 97.8°F | Resp 18

## 2018-12-14 DIAGNOSIS — D500 Iron deficiency anemia secondary to blood loss (chronic): Secondary | ICD-10-CM

## 2018-12-14 MED ORDER — SODIUM CHLORIDE 0.9 % IV SOLN
510.0000 mg | Freq: Once | INTRAVENOUS | Status: AC
  Administered 2018-12-14: 510 mg via INTRAVENOUS
  Filled 2018-12-14: qty 17

## 2018-12-14 MED ORDER — SODIUM CHLORIDE 0.9 % IV SOLN
INTRAVENOUS | Status: DC
  Administered 2018-12-14: 15:00:00 via INTRAVENOUS

## 2018-12-14 NOTE — Patient Instructions (Signed)
[facility] at [facility] Discharge Instructions  Received Feraheme infusion today. Follow-up as scheduled. Call clinic for any questions or concerns   Thank you for choosing [facility] at [facility] to provide your oncology and hematology care.  To afford each patient quality time with our provider, please arrive at least 15 minutes before your scheduled appointment time.   If you have a lab appointment with the Cancer Center please come in thru the  Main Entrance and check in at the main information desk  You need to re-schedule your appointment should you arrive 10 or more minutes late.  We strive to give you quality time with our providers, and arriving late affects you and other patients whose appointments are after yours.  Also, if you no show three or more times for appointments you may be dismissed from the clinic at the providers discretion.     Again, thank you for choosing [facility].  Our hope is that these requests will decrease the amount of time that you wait before being seen by our physicians.       _____________________________________________________________  Should you have questions after your visit to [facility], please contact our office at [phone] between the hours of 8:00 a.m. and 4:30 p.m.  Voicemails left after 4:00 p.m. will not be returned until the following business day.  For prescription refill requests, have your pharmacy contact our office and allow 72 hours.    Cancer Center Support Programs:   > Cancer Support Group  2nd Tuesday of the month 1pm-2pm, Journey Room   

## 2018-12-14 NOTE — Progress Notes (Signed)
Amy Andersen tolerated Feraheme infusion well without complaints or incident. VSS upon discharge. Pt discharged self ambulatory in satisfactory condition accompanied by her husband 

## 2019-01-04 ENCOUNTER — Other Ambulatory Visit: Payer: Self-pay

## 2019-01-04 ENCOUNTER — Inpatient Hospital Stay (HOSPITAL_COMMUNITY): Payer: Medicaid Other | Attending: Hematology

## 2019-01-04 ENCOUNTER — Encounter (HOSPITAL_COMMUNITY): Payer: Self-pay

## 2019-01-04 VITALS — BP 143/77 | HR 77 | Temp 98.2°F | Resp 18

## 2019-01-04 DIAGNOSIS — D500 Iron deficiency anemia secondary to blood loss (chronic): Secondary | ICD-10-CM

## 2019-01-04 DIAGNOSIS — E538 Deficiency of other specified B group vitamins: Secondary | ICD-10-CM | POA: Insufficient documentation

## 2019-01-04 DIAGNOSIS — Z79899 Other long term (current) drug therapy: Secondary | ICD-10-CM | POA: Insufficient documentation

## 2019-01-04 MED ORDER — CYANOCOBALAMIN 1000 MCG/ML IJ SOLN
1000.0000 ug | INTRAMUSCULAR | Status: DC
  Administered 2019-01-04: 1000 ug via INTRAMUSCULAR
  Filled 2019-01-04: qty 1

## 2019-01-04 NOTE — Patient Instructions (Signed)
[facility] at [facility]  Discharge Instructions:   _______________________________________________________________  Thank you for choosing [facility] at [facility] to provide your oncology and hematology care.  To afford each patient quality time with our providers, please arrive at least 15 minutes before your scheduled appointment.  You need to re-schedule your appointment if you arrive 10 or more minutes late.  We strive to give you quality time with our providers, and arriving late affects you and other patients whose appointments are after yours.  Also, if you no show three or more times for appointments you may be dismissed from the clinic.  Again, thank you for choosing [facility] at [facility]. Our hope is that these requests will allow you access to exceptional care and in a timely manner. _______________________________________________________________  If you have questions after your visit, please contact our office at [phone] between the hours of 8:30 a.m. and 5:00 p.m. Voicemails left after 4:30 p.m. will not be returned until the following business day. _______________________________________________________________  For prescription refill requests, have your pharmacy contact our office. _______________________________________________________________  Recommendations made by the consultant and any test results will be sent to your referring physician. _______________________________________________________________ 

## 2019-01-04 NOTE — Progress Notes (Signed)
Amy Andersen presents today for injection per MD orders. B12  administered IM in left deltoid. Administration without incident. Patient tolerated well.  B12  given today per MD orders. Tolerated without adverse affects. Vital signs stable. No complaints at this time. Discharged from clinic ambulatory. F/U with [facility] as scheduled.

## 2019-02-10 ENCOUNTER — Encounter (HOSPITAL_COMMUNITY): Payer: Self-pay | Admitting: Hematology

## 2019-02-10 ENCOUNTER — Other Ambulatory Visit (HOSPITAL_COMMUNITY): Payer: Self-pay | Admitting: Hematology

## 2019-02-12 ENCOUNTER — Encounter (HOSPITAL_COMMUNITY): Payer: Self-pay | Admitting: Internal Medicine

## 2019-02-12 ENCOUNTER — Other Ambulatory Visit: Payer: Self-pay

## 2019-02-12 ENCOUNTER — Inpatient Hospital Stay (HOSPITAL_COMMUNITY): Payer: Self-pay | Attending: Internal Medicine | Admitting: Internal Medicine

## 2019-02-12 ENCOUNTER — Inpatient Hospital Stay (HOSPITAL_COMMUNITY): Payer: Self-pay

## 2019-02-12 ENCOUNTER — Other Ambulatory Visit (HOSPITAL_COMMUNITY): Payer: Self-pay | Admitting: Nurse Practitioner

## 2019-02-12 VITALS — BP 135/70 | HR 80 | Temp 97.5°F | Resp 18

## 2019-02-12 VITALS — Wt 199.2 lb

## 2019-02-12 DIAGNOSIS — Z79899 Other long term (current) drug therapy: Secondary | ICD-10-CM

## 2019-02-12 DIAGNOSIS — D500 Iron deficiency anemia secondary to blood loss (chronic): Secondary | ICD-10-CM

## 2019-02-12 DIAGNOSIS — F17210 Nicotine dependence, cigarettes, uncomplicated: Secondary | ICD-10-CM

## 2019-02-12 DIAGNOSIS — N920 Excessive and frequent menstruation with regular cycle: Secondary | ICD-10-CM

## 2019-02-12 DIAGNOSIS — E538 Deficiency of other specified B group vitamins: Secondary | ICD-10-CM

## 2019-02-12 DIAGNOSIS — Z809 Family history of malignant neoplasm, unspecified: Secondary | ICD-10-CM

## 2019-02-12 LAB — VITAMIN B12: Vitamin B-12: 221 pg/mL (ref 180–914)

## 2019-02-12 LAB — CBC WITH DIFFERENTIAL/PLATELET
Abs Immature Granulocytes: 0.02 10*3/uL (ref 0.00–0.07)
Basophils Absolute: 0.1 10*3/uL (ref 0.0–0.1)
Basophils Relative: 1 %
Eosinophils Absolute: 0.4 10*3/uL (ref 0.0–0.5)
Eosinophils Relative: 5 %
HEMATOCRIT: 42.4 % (ref 36.0–46.0)
Hemoglobin: 13.2 g/dL (ref 12.0–15.0)
Immature Granulocytes: 0 %
Lymphocytes Relative: 33 %
Lymphs Abs: 3.1 10*3/uL (ref 0.7–4.0)
MCH: 29.5 pg (ref 26.0–34.0)
MCHC: 31.1 g/dL (ref 30.0–36.0)
MCV: 94.9 fL (ref 80.0–100.0)
Monocytes Absolute: 0.5 10*3/uL (ref 0.1–1.0)
Monocytes Relative: 5 %
Neutro Abs: 5.4 10*3/uL (ref 1.7–7.7)
Neutrophils Relative %: 56 %
Platelets: 293 10*3/uL (ref 150–400)
RBC: 4.47 MIL/uL (ref 3.87–5.11)
RDW: 15.8 % — ABNORMAL HIGH (ref 11.5–15.5)
WBC: 9.6 10*3/uL (ref 4.0–10.5)
nRBC: 0 % (ref 0.0–0.2)

## 2019-02-12 LAB — COMPREHENSIVE METABOLIC PANEL
ALT: 15 U/L (ref 0–44)
AST: 13 U/L — ABNORMAL LOW (ref 15–41)
Albumin: 3.9 g/dL (ref 3.5–5.0)
Alkaline Phosphatase: 53 U/L (ref 38–126)
Anion gap: 7 (ref 5–15)
BUN: 10 mg/dL (ref 6–20)
CO2: 26 mmol/L (ref 22–32)
Calcium: 8.9 mg/dL (ref 8.9–10.3)
Chloride: 104 mmol/L (ref 98–111)
Creatinine, Ser: 0.63 mg/dL (ref 0.44–1.00)
GFR calc Af Amer: >60 mL/min (ref >60)
GFR calc non Af Amer: >60 mL/min (ref >60)
Glucose, Bld: 104 mg/dL — ABNORMAL HIGH (ref 70–99)
Potassium: 3.6 mmol/L (ref 3.5–5.1)
Sodium: 137 mmol/L (ref 135–145)
TOTAL PROTEIN: 7 g/dL (ref 6.5–8.1)
Total Bilirubin: 0.3 mg/dL (ref 0.3–1.2)

## 2019-02-12 LAB — FERRITIN: Ferritin: 27 ng/mL (ref 11–307)

## 2019-02-12 LAB — IRON AND TIBC
Iron: 58 ug/dL (ref 28–170)
SATURATION RATIOS: 18 % (ref 10.4–31.8)
TIBC: 322 ug/dL (ref 250–450)
UIBC: 264 ug/dL

## 2019-02-12 LAB — FOLATE: Folate: 10.5 ng/mL (ref >5.9)

## 2019-02-12 MED ORDER — CYANOCOBALAMIN 1000 MCG/ML IJ SOLN
1000.0000 ug | Freq: Once | INTRAMUSCULAR | Status: AC
  Administered 2019-02-12: 1000 ug via INTRAMUSCULAR

## 2019-02-12 MED ORDER — CYANOCOBALAMIN 1000 MCG/ML IJ SOLN
INTRAMUSCULAR | Status: AC
  Filled 2019-02-12: qty 1

## 2019-02-12 NOTE — Patient Instructions (Signed)
[facility] at [facility] Discharge Instructions  You were seen by Dr. Higgs today   Thank you for choosing [facility] at [facility] to provide your oncology and hematology care.  To afford each patient quality time with our provider, please arrive at least 15 minutes before your scheduled appointment time.   If you have a lab appointment with the Cancer Center please come in thru the  Main Entrance and check in at the main information desk  You need to re-schedule your appointment should you arrive 10 or more minutes late.  We strive to give you quality time with our providers, and arriving late affects you and other patients whose appointments are after yours.  Also, if you no show three or more times for appointments you may be dismissed from the clinic at the providers discretion.     Again, thank you for choosing [facility].  Our hope is that these requests will decrease the amount of time that you wait before being seen by our physicians.       _____________________________________________________________  Should you have questions after your visit to  Cancer Center, please contact our office at [phone] between the hours of 8:00 a.m. and 4:30 p.m.  Voicemails left after 4:00 p.m. will not be returned until the following business day.  For prescription refill requests, have your pharmacy contact our office and allow 72 hours.    Cancer Center Support Programs:   > Cancer Support Group  2nd Tuesday of the month 1pm-2pm, Journey Room   

## 2019-02-12 NOTE — Progress Notes (Signed)
B12 injection given today per orders.  Patient tolerated it well without problems. Vitals stable and discharged home from clinic ambulatory. Follow up as scheduled.  

## 2019-02-12 NOTE — Progress Notes (Signed)
Diagnosis Iron deficiency anemia due to chronic blood loss - Plan: CBC with Differential, Comprehensive metabolic panel, Lactate dehydrogenase, Ferritin, Vitamin B12, Methylmalonic acid, serum  Staging Cancer Staging No matching staging information was found for the patient.  Assessment and Plan:  1.  Severe iron deficiency anemia.  Pt was previously followed by Dr. Ellin Saba.   - Amy Andersen was found to be severely anemic with a hemoglobin of 6.8 in March 2019.  Amy Andersen was started on oral iron therapy.  Her hemoglobin on 03/27/2018 improved to 8.9. - Blood work on 06/04/2018 showed ferritin of 5, TIBC of 505, vitamin B12 of 233.  Folic acid was normal. - Amy Andersen is actively menstruating and reports heavy cycles.    Pt was previously treated with Feraheme. Amy Andersen was most recently treated in 11/2018. Labs done 02/12/2019 reviewed and showed WBC 9.6 HB 13.2 plts 293,000.  Chemistries WNL with K+ 3.6 Cr 0.63 and normal LFTs.  Ferritin is low normal at 27 but improved from 7 on labs done 11/2018.  Amy Andersen will be given option of repeat IV iron.  Pt is referred to GI for evaluation due to persistent IDA.  Suspect menorrhagia is etiology and Amy Andersen should follow-up with GYN as directed. Will repeat labs in 6-8 weeks.    2.  Vitamin B12 deficiency:B12 221.  Continue monthly B12 injections.    3.  Menorrhagia.  Pt should follow-up with GYN.    4.  Health maintenance.  Pt is referred to GI due to IDA.    25 minutes spent with more than 50% spent in review of records, counseling and coordination of care.    Current Status:  Pt is seen today for follow-up to go over labs.  Amy Andersen reports Amy Andersen feels better after IV iron.     Problem List Patient Active Problem List   Diagnosis Date Noted  . B12 deficiency [E53.8] 11/30/2018  . Iron deficiency anemia due to chronic blood loss [D50.0] 06/19/2018    Past Medical History Past Medical History:  Diagnosis Date  . Chronic pain     Past Surgical History Past Surgical  History:  Procedure Laterality Date  . DILATION AND CURETTAGE OF UTERUS      Family History Family History  Problem Relation Age of Onset  . Hypertension Maternal Grandmother   . CAD Maternal Grandmother   . Cancer Maternal Grandfather   . Hypertension Mother      Social History  reports that Amy Andersen has been smoking cigarettes. Amy Andersen has been smoking about 0.50 packs per day. Amy Andersen has never used smokeless tobacco. Amy Andersen reports that Amy Andersen does not drink alcohol or use drugs.  Medications  Current Outpatient Medications:  .  HYDROcodone-acetaminophen (NORCO/VICODIN) 5-325 MG tablet, Take 1 tablet by mouth every 6 (six) hours as needed for moderate pain (Must last 30 days.Do not take and drive a car or use machinery.)., Disp: 20 tablet, Rfl: 0 .  naproxen (NAPROSYN) 500 MG tablet, TAKE ONE TABLET BY MOUTH TWICE DAILY AFTER MEALS., Disp: 60 tablet, Rfl: 5  Allergies Patient has no known allergies.  Review of Systems Review of Systems - Oncology ROS negative   Physical Exam  Vitals Wt Readings from Last 3 Encounters:  02/12/19 199 lb 3.2 oz (90.4 kg)  11/30/18 197 lb (89.4 kg)  07/22/18 193 lb 12.8 oz (87.9 kg)   Temp Readings from Last 3 Encounters:  02/12/19 (!) 97.5 F (36.4 C) (Oral)  01/04/19 98.2 F (36.8 C) (Oral)  12/14/18 97.8 F (36.6 C) (  Oral)   BP Readings from Last 3 Encounters:  02/12/19 135/70  01/04/19 (!) 143/77  12/14/18 116/61   Pulse Readings from Last 3 Encounters:  02/12/19 80  01/04/19 77  12/14/18 74   Constitutional: Well-developed, well-nourished, and in no distress.   HENT: Head: Normocephalic and atraumatic.  Mouth/Throat: No oropharyngeal exudate. Mucosa moist. Eyes: Pupils are equal, round, and reactive to light. Conjunctivae are normal. No scleral icterus.  Neck: Normal range of motion. Neck supple. No JVD present.  Cardiovascular: Normal rate, regular rhythm and normal heart sounds.  Exam reveals no gallop and no friction rub.   No  murmur heard. Pulmonary/Chest: Effort normal and breath sounds normal. No respiratory distress. No wheezes.No rales.  Abdominal: Soft. Bowel sounds are normal. No distension. There is no tenderness. There is no guarding.  Musculoskeletal: No edema or tenderness.  Lymphadenopathy: No cervical, axillary or supraclavicular adenopathy.  Neurological: Alert and oriented to person, place, and time. No cranial nerve deficit.  Skin: Skin is warm and dry. No rash noted. No erythema. No pallor.  Psychiatric: Affect and judgment normal.   Labs Appointment on 02/12/2019  Component Date Value Ref Range Status  . WBC 02/12/2019 9.6  4.0 - 10.5 K/uL Final  . RBC 02/12/2019 4.47  3.87 - 5.11 MIL/uL Final  . Hemoglobin 02/12/2019 13.2  12.0 - 15.0 g/dL Final  . HCT 86/57/8469 42.4  36.0 - 46.0 % Final  . MCV 02/12/2019 94.9  80.0 - 100.0 fL Final  . MCH 02/12/2019 29.5  26.0 - 34.0 pg Final  . MCHC 02/12/2019 31.1  30.0 - 36.0 g/dL Final  . RDW 62/95/2841 15.8* 11.5 - 15.5 % Final  . Platelets 02/12/2019 293  150 - 400 K/uL Final  . nRBC 02/12/2019 0.0  0.0 - 0.2 % Final  . Neutrophils Relative % 02/12/2019 56  % Final  . Neutro Abs 02/12/2019 5.4  1.7 - 7.7 K/uL Final  . Lymphocytes Relative 02/12/2019 33  % Final  . Lymphs Abs 02/12/2019 3.1  0.7 - 4.0 K/uL Final  . Monocytes Relative 02/12/2019 5  % Final  . Monocytes Absolute 02/12/2019 0.5  0.1 - 1.0 K/uL Final  . Eosinophils Relative 02/12/2019 5  % Final  . Eosinophils Absolute 02/12/2019 0.4  0.0 - 0.5 K/uL Final  . Basophils Relative 02/12/2019 1  % Final  . Basophils Absolute 02/12/2019 0.1  0.0 - 0.1 K/uL Final  . Immature Granulocytes 02/12/2019 0  % Final  . Abs Immature Granulocytes 02/12/2019 0.02  0.00 - 0.07 K/uL Final   Performed at [facility], [address], [place], [place] [address]  . Sodium 02/12/2019 137  135 - 145 mmol/L Final  . Potassium 02/12/2019 3.6  3.5 - 5.1 mmol/L Final  . Chloride 02/12/2019 104  98 - 111  mmol/L Final  . CO2 02/12/2019 26  22 - 32 mmol/L Final  . Glucose, Bld 02/12/2019 104* 70 - 99 mg/dL Final  . BUN 10/12/2535 10  6 - 20 mg/dL Final  . Creatinine, Ser 02/12/2019 0.63  0.44 - 1.00 mg/dL Final  . Calcium 64/40/3474 8.9  8.9 - 10.3 mg/dL Final  . Total Protein 02/12/2019 7.0  6.5 - 8.1 g/dL Final  . Albumin 25/95/6387 3.9  3.5 - 5.0 g/dL Final  . AST 56/43/3295 13* 15 - 41 U/L Final  . ALT 02/12/2019 15  0 - 44 U/L Final  . Alkaline Phosphatase 02/12/2019 53  38 - 126 U/L Final  . Total  Bilirubin 02/12/2019 0.3  0.3 - 1.2 mg/dL Final  . GFR calc non Af Amer 02/12/2019 >60  >60 mL/min Final  . GFR calc Af Amer 02/12/2019 >60  >60 mL/min Final  . Anion gap 02/12/2019 7  5 - 15 Final   Performed at [facility], [address], [place], [place] [address]  . Ferritin 02/12/2019 27  11 - 307 ng/mL Final   Performed at [facility], [address], [place], [place] [address]  . Iron 02/12/2019 58  28 - 170 ug/dL Final  . TIBC 31/49/7026 322  250 - 450 ug/dL Final  . Saturation Ratios 02/12/2019 18  10.4 - 31.8 % Final  . UIBC 02/12/2019 264  ug/dL Final   Performed at [facility], [address] [place], [place] [address]  . Vitamin B-12 02/12/2019 221  180 - 914 pg/mL Final   Comment: (NOTE) This assay is not validated for testing neonatal or myeloproliferative syndrome specimens for Vitamin B12 levels. Performed at [facility], [address], [place], [place] [address]   . Folate 02/12/2019 10.5  >5.9 ng/mL Final   Performed at [facility], [address] [place], [place] [address]     Pathology Orders Placed This Encounter  Procedures  . CBC with Differential    Standing Status:   Future    Standing Expiration Date:   02/13/2020  . Comprehensive metabolic panel    Standing Status:   Future    Standing Expiration Date:   02/13/2020  . Lactate dehydrogenase    Standing Status:   Future    Standing Expiration Date:   02/13/2020  . Ferritin    Standing Status:    Future    Standing Expiration Date:   02/13/2020  . Vitamin B12    Standing Status:   Future    Standing Expiration Date:   02/13/2020  . Methylmalonic acid, serum    Standing Status:   Future    Standing Expiration Date:   02/13/2020       Ahmed Prima MD

## 2019-02-17 ENCOUNTER — Ambulatory Visit (INDEPENDENT_AMBULATORY_CARE_PROVIDER_SITE_OTHER): Payer: Medicaid Other | Admitting: Internal Medicine

## 2019-02-17 ENCOUNTER — Encounter (INDEPENDENT_AMBULATORY_CARE_PROVIDER_SITE_OTHER): Payer: Self-pay | Admitting: Internal Medicine

## 2019-02-27 ENCOUNTER — Other Ambulatory Visit: Payer: Self-pay | Admitting: Orthopaedic Surgery

## 2019-03-15 ENCOUNTER — Ambulatory Visit (HOSPITAL_COMMUNITY): Payer: Medicaid Other

## 2019-03-30 ENCOUNTER — Other Ambulatory Visit: Payer: Self-pay | Admitting: Orthopaedic Surgery

## 2019-04-15 ENCOUNTER — Inpatient Hospital Stay (HOSPITAL_COMMUNITY): Payer: Self-pay | Attending: Hematology

## 2019-04-15 ENCOUNTER — Other Ambulatory Visit: Payer: Self-pay

## 2019-04-15 ENCOUNTER — Encounter (HOSPITAL_COMMUNITY): Payer: Self-pay

## 2019-04-15 VITALS — BP 139/71 | HR 75 | Temp 97.5°F | Resp 18

## 2019-04-15 DIAGNOSIS — E538 Deficiency of other specified B group vitamins: Secondary | ICD-10-CM | POA: Insufficient documentation

## 2019-04-15 DIAGNOSIS — Z79899 Other long term (current) drug therapy: Secondary | ICD-10-CM | POA: Insufficient documentation

## 2019-04-15 DIAGNOSIS — D500 Iron deficiency anemia secondary to blood loss (chronic): Secondary | ICD-10-CM

## 2019-04-15 MED ORDER — CYANOCOBALAMIN 1000 MCG/ML IJ SOLN
1000.0000 ug | Freq: Once | INTRAMUSCULAR | Status: AC
  Administered 2019-04-15: 1000 ug via INTRAMUSCULAR
  Filled 2019-04-15: qty 1

## 2019-05-14 ENCOUNTER — Other Ambulatory Visit: Payer: Self-pay

## 2019-05-14 ENCOUNTER — Inpatient Hospital Stay (HOSPITAL_COMMUNITY): Payer: Self-pay | Attending: Internal Medicine

## 2019-05-14 VITALS — BP 139/92 | HR 87 | Temp 98.5°F | Resp 16

## 2019-05-14 DIAGNOSIS — D500 Iron deficiency anemia secondary to blood loss (chronic): Secondary | ICD-10-CM

## 2019-05-14 DIAGNOSIS — D509 Iron deficiency anemia, unspecified: Secondary | ICD-10-CM | POA: Insufficient documentation

## 2019-05-14 DIAGNOSIS — E538 Deficiency of other specified B group vitamins: Secondary | ICD-10-CM | POA: Insufficient documentation

## 2019-05-14 DIAGNOSIS — Z79899 Other long term (current) drug therapy: Secondary | ICD-10-CM | POA: Insufficient documentation

## 2019-05-14 MED ORDER — CYANOCOBALAMIN 1000 MCG/ML IJ SOLN
1000.0000 ug | Freq: Once | INTRAMUSCULAR | Status: AC
  Administered 2019-05-14: 1000 ug via INTRAMUSCULAR
  Filled 2019-05-14: qty 1

## 2019-05-14 NOTE — Progress Notes (Signed)
Amy Andersen presents today for injection per the provider's orders.  B12 administration without incident; see MAR for injection details.  Patient tolerated procedure well and without incident.  No questions or complaints noted at this time. D/c ambulatory

## 2019-06-11 ENCOUNTER — Other Ambulatory Visit: Payer: Self-pay

## 2019-06-11 ENCOUNTER — Inpatient Hospital Stay (HOSPITAL_COMMUNITY): Payer: Self-pay | Attending: Hematology

## 2019-06-11 DIAGNOSIS — E538 Deficiency of other specified B group vitamins: Secondary | ICD-10-CM | POA: Insufficient documentation

## 2019-06-11 DIAGNOSIS — D500 Iron deficiency anemia secondary to blood loss (chronic): Secondary | ICD-10-CM

## 2019-06-11 DIAGNOSIS — Z79899 Other long term (current) drug therapy: Secondary | ICD-10-CM | POA: Insufficient documentation

## 2019-06-11 LAB — CBC WITH DIFFERENTIAL/PLATELET
Abs Immature Granulocytes: 0.03 10*3/uL (ref 0.00–0.07)
Basophils Absolute: 0.1 10*3/uL (ref 0.0–0.1)
Basophils Relative: 1 %
Eosinophils Absolute: 0.4 10*3/uL (ref 0.0–0.5)
Eosinophils Relative: 5 %
HCT: 38.6 % (ref 36.0–46.0)
Hemoglobin: 12.1 g/dL (ref 12.0–15.0)
Immature Granulocytes: 0 %
Lymphocytes Relative: 28 %
Lymphs Abs: 2.7 10*3/uL (ref 0.7–4.0)
MCH: 29.1 pg (ref 26.0–34.0)
MCHC: 31.3 g/dL (ref 30.0–36.0)
MCV: 92.8 fL (ref 80.0–100.0)
Monocytes Absolute: 0.3 10*3/uL (ref 0.1–1.0)
Monocytes Relative: 4 %
Neutro Abs: 6 10*3/uL (ref 1.7–7.7)
Neutrophils Relative %: 62 %
Platelets: 345 10*3/uL (ref 150–400)
RBC: 4.16 MIL/uL (ref 3.87–5.11)
RDW: 13.2 % (ref 11.5–15.5)
WBC: 9.6 10*3/uL (ref 4.0–10.5)
nRBC: 0 % (ref 0.0–0.2)

## 2019-06-11 LAB — IRON AND TIBC
Iron: 26 ug/dL — ABNORMAL LOW (ref 28–170)
Saturation Ratios: 5 % — ABNORMAL LOW (ref 10.4–31.8)
TIBC: 477 ug/dL — ABNORMAL HIGH (ref 250–450)
UIBC: 451 ug/dL

## 2019-06-11 LAB — COMPREHENSIVE METABOLIC PANEL
ALT: 19 U/L (ref 0–44)
AST: 13 U/L — ABNORMAL LOW (ref 15–41)
Albumin: 4.2 g/dL (ref 3.5–5.0)
Alkaline Phosphatase: 69 U/L (ref 38–126)
Anion gap: 10 (ref 5–15)
BUN: 11 mg/dL (ref 6–20)
CO2: 25 mmol/L (ref 22–32)
Calcium: 9.2 mg/dL (ref 8.9–10.3)
Chloride: 102 mmol/L (ref 98–111)
Creatinine, Ser: 0.71 mg/dL (ref 0.44–1.00)
GFR calc Af Amer: >60 mL/min (ref >60)
GFR calc non Af Amer: >60 mL/min (ref >60)
Glucose, Bld: 109 mg/dL — ABNORMAL HIGH (ref 70–99)
Potassium: 3.3 mmol/L — ABNORMAL LOW (ref 3.5–5.1)
Sodium: 137 mmol/L (ref 135–145)
Total Bilirubin: 0.5 mg/dL (ref 0.3–1.2)
Total Protein: 7.7 g/dL (ref 6.5–8.1)

## 2019-06-11 LAB — FERRITIN: Ferritin: 5 ng/mL — ABNORMAL LOW (ref 11–307)

## 2019-06-11 LAB — LACTATE DEHYDROGENASE: LDH: 109 U/L (ref 98–192)

## 2019-06-11 LAB — VITAMIN B12: Vitamin B-12: 351 pg/mL (ref 180–914)

## 2019-06-11 NOTE — Progress Notes (Signed)
For review.  Please update ordering physician

## 2019-06-14 ENCOUNTER — Other Ambulatory Visit: Payer: Self-pay

## 2019-06-14 ENCOUNTER — Inpatient Hospital Stay (HOSPITAL_COMMUNITY): Payer: Self-pay

## 2019-06-14 ENCOUNTER — Inpatient Hospital Stay (HOSPITAL_COMMUNITY): Payer: Self-pay | Attending: Nurse Practitioner | Admitting: Nurse Practitioner

## 2019-06-14 DIAGNOSIS — N920 Excessive and frequent menstruation with regular cycle: Secondary | ICD-10-CM | POA: Insufficient documentation

## 2019-06-14 DIAGNOSIS — D500 Iron deficiency anemia secondary to blood loss (chronic): Secondary | ICD-10-CM

## 2019-06-14 DIAGNOSIS — E538 Deficiency of other specified B group vitamins: Secondary | ICD-10-CM | POA: Insufficient documentation

## 2019-06-14 DIAGNOSIS — F17210 Nicotine dependence, cigarettes, uncomplicated: Secondary | ICD-10-CM | POA: Insufficient documentation

## 2019-06-14 DIAGNOSIS — R5383 Other fatigue: Secondary | ICD-10-CM | POA: Insufficient documentation

## 2019-06-14 DIAGNOSIS — G8929 Other chronic pain: Secondary | ICD-10-CM | POA: Insufficient documentation

## 2019-06-14 DIAGNOSIS — D509 Iron deficiency anemia, unspecified: Secondary | ICD-10-CM | POA: Insufficient documentation

## 2019-06-14 LAB — METHYLMALONIC ACID, SERUM: Methylmalonic Acid, Quantitative: 167 nmol/L (ref 0–378)

## 2019-06-14 MED ORDER — CYANOCOBALAMIN 1000 MCG/ML IJ SOLN
1000.0000 ug | Freq: Once | INTRAMUSCULAR | Status: AC
  Administered 2019-06-14: 1000 ug via INTRAMUSCULAR

## 2019-06-14 MED ORDER — CYANOCOBALAMIN 1000 MCG/ML IJ SOLN
INTRAMUSCULAR | Status: AC
  Filled 2019-06-14: qty 1

## 2019-06-14 NOTE — Progress Notes (Signed)
[facility] [address] [place], [place] [address]   CLINIC:  Medical Oncology/Hematology  PCP:  Elfredia NevinsFusco, Lawrence, MD [address] [place] [address] [phone]   REASON FOR VISIT: Follow-up for iron deficiency anemia  CURRENT THERAPY: Intermittent iron infusions   INTERVAL HISTORY:  Amy Andersen 43 y.o. female returns for routine follow-up for iron deficiency anemia.  She reports she is been doing great since her last visit.  She denies any bright red bleeding per rectum or melena.  She does report some of her fatigue is coming back slightly. Denies any nausea, vomiting, or diarrhea. Denies any new pains. Had not noticed any recent bleeding such as epistaxis, hematuria or hematochezia. Denies recent chest pain on exertion, shortness of breath on minimal exertion, pre-syncopal episodes, or palpitations. Denies any numbness or tingling in hands or feet. Denies any recent fevers, infections, or recent hospitalizations. Patient reports appetite at 75% and energy level at 25%.  She is eating well and maintaining her weight at this time.    REVIEW OF SYSTEMS:  Review of Systems  Neurological: Positive for dizziness.  All other systems reviewed and are negative.    PAST MEDICAL/SURGICAL HISTORY:  Past Medical History:  Diagnosis Date  . Chronic pain    Past Surgical History:  Procedure Laterality Date  . DILATION AND CURETTAGE OF UTERUS       SOCIAL HISTORY:  Social History   Socioeconomic History  . Marital status: Married    Spouse name: Tasia CatchingsCraig  . Number of children: 3  . Years of education: 9  . Highest education level: 9th grade  Occupational History  . Not on file  Social Needs  . Financial resource strain: Somewhat hard  . Food insecurity    Worry: Often true    Inability: Often true  . Transportation needs    Medical: No    Non-medical: No  Tobacco Use  . Smoking status: Current Every Day Smoker    Packs/day: 0.50    Types:  Cigarettes  . Smokeless tobacco: Never Used  Substance and Sexual Activity  . Alcohol use: No  . Drug use: No  . Sexual activity: Yes    Birth control/protection: None  Lifestyle  . Physical activity    Days per week: 0 days    Minutes per session: Not on file  . Stress: Rather much  Relationships  . Social connections    Talks on phone: More than three times a week    Gets together: Twice a week    Attends religious service: Never    Active member of club or organization: No    Attends meetings of clubs or organizations: Never    Relationship status: Married  . Intimate partner violence    Fear of current or ex partner: No    Emotionally abused: No    Physically abused: No    Forced sexual activity: No  Other Topics Concern  . Not on file  Social History Narrative  . Not on file    FAMILY HISTORY:  Family History  Problem Relation Age of Onset  . Hypertension Maternal Grandmother   . CAD Maternal Grandmother   . Cancer Maternal Grandfather   . Hypertension Mother     CURRENT MEDICATIONS:  Outpatient Encounter Medications as of 06/14/2019  Medication Sig  . HYDROcodone-acetaminophen (NORCO/VICODIN) 5-325 MG tablet Take 1 tablet by mouth every 6 (six) hours as needed for moderate pain (Must last 30 days.Do not take and  drive a car or use machinery.).  Marland Kitchen naproxen (NAPROSYN) 500 MG tablet TAKE ONE TABLET BY MOUTH TWICE DAILY AFTER MEALS.   No facility-administered encounter medications on file as of 06/14/2019.     ALLERGIES:  No Known Allergies   PHYSICAL EXAM:  ECOG Performance status: 1  Vitals:   06/14/19 0943  BP: 136/68  Pulse: 91  Resp: 18  Temp: 98.6 F (37 C)  SpO2: 99%   Filed Weights   06/14/19 0943  Weight: 202 lb 11.2 oz (91.9 kg)    Physical Exam Constitutional:      Appearance: Normal appearance. She is normal weight.  Cardiovascular:     Rate and Rhythm: Normal rate and regular rhythm.     Heart sounds: Normal heart sounds.   Pulmonary:     Effort: Pulmonary effort is normal.     Breath sounds: Normal breath sounds.  Abdominal:     General: Bowel sounds are normal.     Palpations: Abdomen is soft.  Musculoskeletal: Normal range of motion.  Skin:    General: Skin is warm and dry.  Neurological:     Mental Status: She is alert and oriented to person, place, and time. Mental status is at baseline.  Psychiatric:        Mood and Affect: Mood normal.        Behavior: Behavior normal.        Thought Content: Thought content normal.        Judgment: Judgment normal.      LABORATORY DATA:  I have reviewed the labs as listed.  CBC    Component Value Date/Time   WBC 9.6 06/11/2019 1044   RBC 4.16 06/11/2019 1044   HGB 12.1 06/11/2019 1044   HCT 38.6 06/11/2019 1044   PLT 345 06/11/2019 1044   MCV 92.8 06/11/2019 1044   MCH 29.1 06/11/2019 1044   MCHC 31.3 06/11/2019 1044   RDW 13.2 06/11/2019 1044   LYMPHSABS 2.7 06/11/2019 1044   MONOABS 0.3 06/11/2019 1044   EOSABS 0.4 06/11/2019 1044   BASOSABS 0.1 06/11/2019 1044   CMP Latest Ref Rng & Units 06/11/2019 02/12/2019 11/23/2018  Glucose 70 - 99 mg/dL 109(H) 104(H) 107(H)  BUN 6 - 20 mg/dL 11 10 9   Creatinine 0.44 - 1.00 mg/dL 0.71 0.63 0.73  Sodium 135 - 145 mmol/L 137 137 136  Potassium 3.5 - 5.1 mmol/L 3.3(L) 3.6 3.8  Chloride 98 - 111 mmol/L 102 104 105  CO2 22 - 32 mmol/L 25 26 24   Calcium 8.9 - 10.3 mg/dL 9.2 8.9 8.6(L)  Total Protein 6.5 - 8.1 g/dL 7.7 7.0 7.3  Total Bilirubin 0.3 - 1.2 mg/dL 0.5 0.3 0.6  Alkaline Phos 38 - 126 U/L 69 53 51  AST 15 - 41 U/L 13(L) 13(L) 15  ALT 0 - 44 U/L 19 15 14      I personally performed a face-to-face visit.  All questions were answered to patient's stated satisfaction. Encouraged patient to call with any new concerns or questions before his next visit to the cancer center and we can certain see him sooner, if needed.     ASSESSMENT & PLAN:   Iron deficiency anemia due to chronic blood loss 1.   Severe iron deficiency anemia: -She was found to be severely anemic with a hemoglobin of 6.8 in March 2019. -She was started on oral iron therapy at that time.  Her hemoglobin on 03/27/2018 had improved to 8.9. - She is actively menstruating with  it lasting 7 days every 28 days.  Her heaviest bleeding is for 2 days. -Last Feraheme infusion was on 09/02/2018 and 12/14/2018. -She denies any bright red bleeding per rectum or melena. -Labs on 06/11/2019 showed her hemoglobin 12.1, ferritin 5, percent saturation 5, platelets 345 -She would like to wait a few more weeks for iron infusions. -She will follow-up in 8 weeks with repeat labs.  2.  Vitamin B12 deficiency: - Her B12 was borderline low at 233. -She received 1 injection of vitamin B12 and was started on oral B12 1 mg tablet daily. - She reports she did not miss any doses however her vitamin B12 came down to 181.  She is likely not absorbing the oral B12. -We will start her on B12 injections monthly. -Labs on 06/11/2019 showed her vitamin B12 level increased to 351.      Orders placed this encounter:  Orders Placed This Encounter  Procedures  . Lactate dehydrogenase  . CBC with Differential/Platelet  . Comprehensive metabolic panel  . Ferritin  . Iron and TIBC  . Vitamin B12  . VITAMIN D 25 Hydroxy (Vit-D Deficiency, Fractures)  . Folate      Mathis Budandi Ryann Leavitt FNP-C [facility] [phone]

## 2019-06-14 NOTE — Assessment & Plan Note (Addendum)
1.  Severe iron deficiency anemia: -She was found to be severely anemic with a hemoglobin of 6.8 in March 2019. -She was started on oral iron therapy at that time.  Her hemoglobin on 03/27/2018 had improved to 8.9. - She is actively menstruating with it lasting 7 days every 28 days.  Her heaviest bleeding is for 2 days. -Last Feraheme infusion was on 09/02/2018 and 12/14/2018. -She denies any bright red bleeding per rectum or melena. -Labs on 06/11/2019 showed her hemoglobin 12.1, ferritin 5, percent saturation 5, platelets 345 -She would like to wait a few more weeks for iron infusions. -She will follow-up in 8 weeks with repeat labs.  2.  Vitamin B12 deficiency: - Her B12 was borderline low at 233. -She received 1 injection of vitamin B12 and was started on oral B12 1 mg tablet daily. - She reports she did not miss any doses however her vitamin B12 came down to 181.  She is likely not absorbing the oral B12. -We will start her on B12 injections monthly. -Labs on 06/11/2019 showed her vitamin B12 level increased to 351.

## 2019-06-14 NOTE — Patient Instructions (Signed)
[facility] Discharge Instructions  Follow up in 2 months with labs   Thank you for choosing  Cancer Center at [facility] to provide your oncology and hematology care.  To afford each patient quality time with our provider, please arrive at least 15 minutes before your scheduled appointment time.   If you have a lab appointment with the Cancer Center please come in thru the  Main Entrance and check in at the main information desk  You need to re-schedule your appointment should you arrive 10 or more minutes late.  We strive to give you quality time with our providers, and arriving late affects you and other patients whose appointments are after yours.  Also, if you no show three or more times for appointments you may be dismissed from the clinic at the providers discretion.     Again, thank you for choosing [facility].  Our hope is that these requests will decrease the amount of time that you wait before being seen by our physicians.       _____________________________________________________________  Should you have questions after your visit to [facility], please contact our office at [phone] between the hours of 8:00 a.m. and 4:30 p.m.  Voicemails left after 4:00 p.m. will not be returned until the following business day.  For prescription refill requests, have your pharmacy contact our office and allow 72 hours.    Cancer Center Support Programs:   > Cancer Support Group  2nd Tuesday of the month 1pm-2pm, Journey Room    

## 2019-07-14 ENCOUNTER — Inpatient Hospital Stay (HOSPITAL_COMMUNITY): Payer: Self-pay | Attending: Internal Medicine

## 2019-07-14 ENCOUNTER — Encounter (HOSPITAL_COMMUNITY): Payer: Self-pay

## 2019-07-14 ENCOUNTER — Other Ambulatory Visit: Payer: Self-pay

## 2019-07-14 VITALS — BP 143/84 | HR 90 | Temp 98.6°F | Resp 18

## 2019-07-14 DIAGNOSIS — E538 Deficiency of other specified B group vitamins: Secondary | ICD-10-CM | POA: Insufficient documentation

## 2019-07-14 DIAGNOSIS — D500 Iron deficiency anemia secondary to blood loss (chronic): Secondary | ICD-10-CM

## 2019-07-14 DIAGNOSIS — Z79899 Other long term (current) drug therapy: Secondary | ICD-10-CM | POA: Insufficient documentation

## 2019-07-14 MED ORDER — CYANOCOBALAMIN 1000 MCG/ML IJ SOLN
1000.0000 ug | Freq: Once | INTRAMUSCULAR | Status: AC
  Administered 2019-07-14: 1000 ug via INTRAMUSCULAR
  Filled 2019-07-14: qty 1

## 2019-07-14 NOTE — Progress Notes (Signed)
Patient tolerated injection with no complaints voiced.  Site clean and dry with no bruising or swelling noted at site.  Band aid applied.  Vss with discharge and left ambulatory with no s/s of distress noted.  

## 2019-08-05 ENCOUNTER — Other Ambulatory Visit: Payer: Self-pay | Admitting: Orthopaedic Surgery

## 2019-08-16 ENCOUNTER — Inpatient Hospital Stay (HOSPITAL_COMMUNITY): Payer: Medicaid Other | Attending: Hematology

## 2019-08-16 ENCOUNTER — Other Ambulatory Visit: Payer: Self-pay

## 2019-08-16 DIAGNOSIS — N920 Excessive and frequent menstruation with regular cycle: Secondary | ICD-10-CM | POA: Insufficient documentation

## 2019-08-16 DIAGNOSIS — E538 Deficiency of other specified B group vitamins: Secondary | ICD-10-CM | POA: Insufficient documentation

## 2019-08-16 DIAGNOSIS — D500 Iron deficiency anemia secondary to blood loss (chronic): Secondary | ICD-10-CM | POA: Insufficient documentation

## 2019-08-16 LAB — FOLATE: Folate: 11 ng/mL (ref >5.9)

## 2019-08-16 LAB — COMPREHENSIVE METABOLIC PANEL
ALT: 11 U/L (ref 0–44)
AST: 13 U/L — ABNORMAL LOW (ref 15–41)
Albumin: 4 g/dL (ref 3.5–5.0)
Alkaline Phosphatase: 61 U/L (ref 38–126)
Anion gap: 9 (ref 5–15)
BUN: 9 mg/dL (ref 6–20)
CO2: 24 mmol/L (ref 22–32)
Calcium: 9.3 mg/dL (ref 8.9–10.3)
Chloride: 105 mmol/L (ref 98–111)
Creatinine, Ser: 0.78 mg/dL (ref 0.44–1.00)
GFR calc Af Amer: >60 mL/min (ref >60)
GFR calc non Af Amer: >60 mL/min (ref >60)
Glucose, Bld: 134 mg/dL — ABNORMAL HIGH (ref 70–99)
Potassium: 3.6 mmol/L (ref 3.5–5.1)
Sodium: 138 mmol/L (ref 135–145)
Total Bilirubin: 0.3 mg/dL (ref 0.3–1.2)
Total Protein: 7.8 g/dL (ref 6.5–8.1)

## 2019-08-16 LAB — CBC WITH DIFFERENTIAL/PLATELET
Abs Immature Granulocytes: 0.02 10*3/uL (ref 0.00–0.07)
Basophils Absolute: 0.1 10*3/uL (ref 0.0–0.1)
Basophils Relative: 1 %
Eosinophils Absolute: 0.3 10*3/uL (ref 0.0–0.5)
Eosinophils Relative: 4 %
HCT: 36.7 % (ref 36.0–46.0)
Hemoglobin: 10.6 g/dL — ABNORMAL LOW (ref 12.0–15.0)
Immature Granulocytes: 0 %
Lymphocytes Relative: 29 %
Lymphs Abs: 2.3 10*3/uL (ref 0.7–4.0)
MCH: 24.5 pg — ABNORMAL LOW (ref 26.0–34.0)
MCHC: 28.9 g/dL — ABNORMAL LOW (ref 30.0–36.0)
MCV: 85 fL (ref 80.0–100.0)
Monocytes Absolute: 0.4 10*3/uL (ref 0.1–1.0)
Monocytes Relative: 5 %
Neutro Abs: 5.1 10*3/uL (ref 1.7–7.7)
Neutrophils Relative %: 61 %
Platelets: 347 10*3/uL (ref 150–400)
RBC: 4.32 MIL/uL (ref 3.87–5.11)
RDW: 15.5 % (ref 11.5–15.5)
WBC: 8.2 10*3/uL (ref 4.0–10.5)
nRBC: 0 % (ref 0.0–0.2)

## 2019-08-16 LAB — IRON AND TIBC
Iron: 15 ug/dL — ABNORMAL LOW (ref 28–170)
Saturation Ratios: 3 % — ABNORMAL LOW (ref 10.4–31.8)
TIBC: 504 ug/dL — ABNORMAL HIGH (ref 250–450)
UIBC: 489 ug/dL

## 2019-08-16 LAB — LACTATE DEHYDROGENASE: LDH: 110 U/L (ref 98–192)

## 2019-08-16 LAB — VITAMIN B12: Vitamin B-12: 280 pg/mL (ref 180–914)

## 2019-08-16 LAB — FERRITIN: Ferritin: 4 ng/mL — ABNORMAL LOW (ref 11–307)

## 2019-08-17 ENCOUNTER — Ambulatory Visit (HOSPITAL_COMMUNITY): Payer: Medicaid Other | Admitting: Nurse Practitioner

## 2019-08-17 ENCOUNTER — Ambulatory Visit (HOSPITAL_COMMUNITY): Payer: Medicaid Other

## 2019-08-17 LAB — VITAMIN D 25 HYDROXY (VIT D DEFICIENCY, FRACTURES): Vit D, 25-Hydroxy: 16 ng/mL — ABNORMAL LOW (ref 30.0–100.0)

## 2019-08-18 ENCOUNTER — Inpatient Hospital Stay (HOSPITAL_COMMUNITY): Payer: Self-pay | Attending: Internal Medicine | Admitting: Nurse Practitioner

## 2019-08-18 ENCOUNTER — Other Ambulatory Visit: Payer: Self-pay

## 2019-08-18 ENCOUNTER — Inpatient Hospital Stay (HOSPITAL_COMMUNITY): Payer: Self-pay

## 2019-08-18 ENCOUNTER — Encounter (HOSPITAL_COMMUNITY): Payer: Self-pay

## 2019-08-18 ENCOUNTER — Encounter (HOSPITAL_COMMUNITY): Payer: Self-pay | Admitting: Nurse Practitioner

## 2019-08-18 VITALS — BP 141/75 | HR 110 | Temp 97.8°F | Resp 18

## 2019-08-18 DIAGNOSIS — R5383 Other fatigue: Secondary | ICD-10-CM | POA: Insufficient documentation

## 2019-08-18 DIAGNOSIS — N920 Excessive and frequent menstruation with regular cycle: Secondary | ICD-10-CM | POA: Insufficient documentation

## 2019-08-18 DIAGNOSIS — E538 Deficiency of other specified B group vitamins: Secondary | ICD-10-CM | POA: Insufficient documentation

## 2019-08-18 DIAGNOSIS — F17210 Nicotine dependence, cigarettes, uncomplicated: Secondary | ICD-10-CM | POA: Insufficient documentation

## 2019-08-18 DIAGNOSIS — D500 Iron deficiency anemia secondary to blood loss (chronic): Secondary | ICD-10-CM | POA: Insufficient documentation

## 2019-08-18 DIAGNOSIS — Z791 Long term (current) use of non-steroidal anti-inflammatories (NSAID): Secondary | ICD-10-CM | POA: Insufficient documentation

## 2019-08-18 DIAGNOSIS — Z79899 Other long term (current) drug therapy: Secondary | ICD-10-CM | POA: Insufficient documentation

## 2019-08-18 DIAGNOSIS — R42 Dizziness and giddiness: Secondary | ICD-10-CM | POA: Insufficient documentation

## 2019-08-18 DIAGNOSIS — G479 Sleep disorder, unspecified: Secondary | ICD-10-CM | POA: Insufficient documentation

## 2019-08-18 MED ORDER — CYANOCOBALAMIN 1000 MCG/ML IJ SOLN
1000.0000 ug | Freq: Once | INTRAMUSCULAR | Status: AC
  Administered 2019-08-18: 1000 ug via INTRAMUSCULAR

## 2019-08-18 MED ORDER — CYANOCOBALAMIN 1000 MCG/ML IJ SOLN
INTRAMUSCULAR | Status: AC
  Filled 2019-08-18: qty 1

## 2019-08-18 NOTE — Progress Notes (Signed)
[facility] [address], [place] [address]   CLINIC:  Medical Oncology/Hematology  PCP:  Elfredia Nevins, MD [address] [place] [address] [phone]   REASON FOR VISIT: Follow-up for iron deficiency anemia  CURRENT THERAPY: Intermittent iron infusions.   INTERVAL HISTORY:  Ms. Walden 43 y.o. female returns for routine follow-up for iron deficiency anemia.  She reports she is more fatigued and occasionally dizzy.  She denies any bright red bleeding per rectum or melena.  She does still have heavy periods.  She denies any easy bruising. Denies any nausea, vomiting, or diarrhea. Denies any new pains. Had not noticed any recent bleeding such as epistaxis, hematuria or hematochezia. Denies recent chest pain on exertion, shortness of breath on minimal exertion, pre-syncopal episodes, or palpitations. Denies any numbness or tingling in hands or feet. Denies any recent fevers, infections, or recent hospitalizations. Patient reports appetite at 25 % and energy level at 0 %.  She is maintaining her weight at this time.    REVIEW OF SYSTEMS:  Review of Systems  Constitutional: Positive for fatigue.  Neurological: Positive for dizziness.  Psychiatric/Behavioral: Positive for sleep disturbance.  All other systems reviewed and are negative.    PAST MEDICAL/SURGICAL HISTORY:  Past Medical History:  Diagnosis Date  . Chronic pain    Past Surgical History:  Procedure Laterality Date  . DILATION AND CURETTAGE OF UTERUS       SOCIAL HISTORY:  Social History   Socioeconomic History  . Marital status: Married    Spouse name: Tasia Catchings  . Number of children: 3  . Years of education: 9  . Highest education level: 9th grade  Occupational History  . Not on file  Social Needs  . Financial resource strain: Somewhat hard  . Food insecurity    Worry: Often true    Inability: Often true  . Transportation needs    Medical: No    Non-medical: No   Tobacco Use  . Smoking status: Current Every Day Smoker    Packs/day: 0.50    Types: Cigarettes  . Smokeless tobacco: Never Used  Substance and Sexual Activity  . Alcohol use: No  . Drug use: No  . Sexual activity: Yes    Birth control/protection: None  Lifestyle  . Physical activity    Days per week: 0 days    Minutes per session: Not on file  . Stress: Rather much  Relationships  . Social connections    Talks on phone: More than three times a week    Gets together: Twice a week    Attends religious service: Never    Active member of club or organization: No    Attends meetings of clubs or organizations: Never    Relationship status: Married  . Intimate partner violence    Fear of current or ex partner: No    Emotionally abused: No    Physically abused: No    Forced sexual activity: No  Other Topics Concern  . Not on file  Social History Narrative  . Not on file    FAMILY HISTORY:  Family History  Problem Relation Age of Onset  . Hypertension Maternal Grandmother   . CAD Maternal Grandmother   . Cancer Maternal Grandfather   . Hypertension Mother     CURRENT MEDICATIONS:  Outpatient Encounter Medications as of 08/18/2019  Medication Sig  . naproxen (NAPROSYN) 500 MG tablet TAKE ONE TABLET BY MOUTH TWICE DAILY AFTER MEALS.  . HYDROcodone-acetaminophen (  NORCO/VICODIN) 5-325 MG tablet Take 1 tablet by mouth every 6 (six) hours as needed for moderate pain (Must last 30 days.Do not take and drive a car or use machinery.). (Patient not taking: Reported on 08/18/2019)  . [EXPIRED] cyanocobalamin ((VITAMIN B-12)) injection 1,000 mcg    No facility-administered encounter medications on file as of 08/18/2019.     ALLERGIES:  No Known Allergies   PHYSICAL EXAM:  ECOG Performance status: 1  Vitals:   08/18/19 1033  BP: (!) 141/75  Pulse: (!) 110  Resp: 18  Temp: 97.8 F (36.6 C)  SpO2: 100%   Filed Weights   08/18/19 1033  Weight: 201 lb (91.2 kg)     Physical Exam Constitutional:      Appearance: Normal appearance. She is normal weight.  Cardiovascular:     Rate and Rhythm: Normal rate and regular rhythm.     Heart sounds: Normal heart sounds.  Pulmonary:     Effort: Pulmonary effort is normal.     Breath sounds: Normal breath sounds.  Abdominal:     General: Bowel sounds are normal.     Palpations: Abdomen is soft.  Musculoskeletal: Normal range of motion.  Skin:    General: Skin is warm and dry.  Neurological:     Mental Status: She is alert and oriented to person, place, and time. Mental status is at baseline.  Psychiatric:        Mood and Affect: Mood normal.        Behavior: Behavior normal.        Thought Content: Thought content normal.        Judgment: Judgment normal.      LABORATORY DATA:  I have reviewed the labs as listed.  CBC    Component Value Date/Time   WBC 8.2 08/16/2019 1013   RBC 4.32 08/16/2019 1013   HGB 10.6 (L) 08/16/2019 1013   HCT 36.7 08/16/2019 1013   PLT 347 08/16/2019 1013   MCV 85.0 08/16/2019 1013   MCH 24.5 (L) 08/16/2019 1013   MCHC 28.9 (L) 08/16/2019 1013   RDW 15.5 08/16/2019 1013   LYMPHSABS 2.3 08/16/2019 1013   MONOABS 0.4 08/16/2019 1013   EOSABS 0.3 08/16/2019 1013   BASOSABS 0.1 08/16/2019 1013   CMP Latest Ref Rng & Units 08/16/2019 06/11/2019 02/12/2019  Glucose 70 - 99 mg/dL 098(J134(H) 191(Y109(H) 782(N104(H)  BUN 6 - 20 mg/dL 9 11 10   Creatinine 0.44 - 1.00 mg/dL 5.620.78 1.300.71 8.650.63  Sodium 135 - 145 mmol/L 138 137 137  Potassium 3.5 - 5.1 mmol/L 3.6 3.3(L) 3.6  Chloride 98 - 111 mmol/L 105 102 104  CO2 22 - 32 mmol/L 24 25 26   Calcium 8.9 - 10.3 mg/dL 9.3 9.2 8.9  Total Protein 6.5 - 8.1 g/dL 7.8 7.7 7.0  Total Bilirubin 0.3 - 1.2 mg/dL 0.3 0.5 0.3  Alkaline Phos 38 - 126 U/L 61 69 53  AST 15 - 41 U/L 13(L) 13(L) 13(L)  ALT 0 - 44 U/L 11 19 15      I personally performed a face-to-face visit.  All questions were answered to patient's stated satisfaction. Encouraged patient  to call with any new concerns or questions before his next visit to the cancer center and we can certain see him sooner, if needed.     ASSESSMENT & PLAN:   Iron deficiency anemia due to chronic blood loss 1.  Severe iron deficiency anemia: -She was found to be severely anemic with a hemoglobin of 6.8  in March 2019. -She was started on oral iron therapy at that time.  Her hemoglobin on 03/27/2018 had improved to 8.9. - She is actively menstruating with it lasting 7 days every 28 days.  Her heaviest bleeding is for 2 days. -Last Feraheme infusion was on 09/02/2018 and 12/14/2018. -She denies any bright red bleeding per rectum or melena. -Labs on 06/11/2019 showed her hemoglobin 12.1, ferritin 5, percent saturation 5, platelets 345 -She would like to wait a few more weeks for iron infusions. -Labs on 08/16/2019 showed her hemoglobin has dropped to 10.6, ferritin 4, percent saturation 3, platelets 347. -We will set her up with 2 infusions of IV iron -She will follow-up in 3 months with repeat labs.  2.  Vitamin B12 deficiency: - Her B12 was borderline low at 233. -She received 1 injection of vitamin B12 and was started on oral B12 1 mg tablet daily. - She reports she did not miss any doses however her vitamin B12 came down to 181.  She is likely not absorbing the oral B12. -We will start her on B12 injections monthly. -Labs on 06/11/2019 showed her vitamin B12 level increased to 351. - Labs done on 08/16/2019 showed her vitamin B 12 at 280 -She will receive her injection today.  She will also continue monthly B12 injections -We will check labs again at her next visit.      Orders placed this encounter:  Orders Placed This Encounter  Procedures  . Lactate dehydrogenase  . CBC with Differential/Platelet  . Comprehensive metabolic panel  . Ferritin  . Iron and TIBC  . Vitamin B12  . VITAMIN D 25 Hydroxy (Vit-D Deficiency, Fractures)      Francene Finders, FNP-C [facility]  [phone]

## 2019-08-18 NOTE — Progress Notes (Signed)
Patient tolerated injection with no complaints voiced.  Site clean and dry with no bruising or swelling noted at site.  Band aid applied.  Vss with discharge and left ambulatory with no s/s of distress noted.  

## 2019-08-18 NOTE — Patient Instructions (Signed)
[facility] at [facility] Discharge Instructions  Follow up in 3 months with lab s   Thank you for choosing [facility] at [facility] to provide your oncology and hematology care.  To afford each patient quality time with our provider, please arrive at least 15 minutes before your scheduled appointment time.   If you have a lab appointment with the Cancer Center please come in thru the Main Entrance and check in at the main information desk.  You need to re-schedule your appointment should you arrive 10 or more minutes late.  We strive to give you quality time with our providers, and arriving late affects you and other patients whose appointments are after yours.  Also, if you no show three or more times for appointments you may be dismissed from the clinic at the providers discretion.     Again, thank you for choosing [facility].  Our hope is that these requests will decrease the amount of time that you wait before being seen by our physicians.       _____________________________________________________________  Should you have questions after your visit to [facility], please contact our office at [phone] between the hours of 8:00 a.m. and 4:30 p.m.  Voicemails left after 4:00 p.m. will not be returned until the following business day.  For prescription refill requests, have your pharmacy contact our office and allow 72 hours.    Due to Covid, you will need to wear a mask upon entering the hospital. If you do not have a mask, a mask will be given to you at the Main Entrance upon arrival. For doctor visits, patients may have 1 support person with them. For treatment visits, patients can not have anyone with them due to social distancing guidelines and our immunocompromised population.      

## 2019-08-18 NOTE — Assessment & Plan Note (Addendum)
1.  Severe iron deficiency anemia: -She was found to be severely anemic with a hemoglobin of 6.8 in March 2019. -She was started on oral iron therapy at that time.  Her hemoglobin on 03/27/2018 had improved to 8.9. - She is actively menstruating with it lasting 7 days every 28 days.  Her heaviest bleeding is for 2 days. -Last Feraheme infusion was on 09/02/2018 and 12/14/2018. -She denies any bright red bleeding per rectum or melena. -Labs on 06/11/2019 showed her hemoglobin 12.1, ferritin 5, percent saturation 5, platelets 345 -She would like to wait a few more weeks for iron infusions. -Labs on 08/16/2019 showed her hemoglobin has dropped to 10.6, ferritin 4, percent saturation 3, platelets 347. -We will set her up with 2 infusions of IV iron -She will follow-up in 3 months with repeat labs.  2.  Vitamin B12 deficiency: - Her B12 was borderline low at 233. -She received 1 injection of vitamin B12 and was started on oral B12 1 mg tablet daily. - She reports she did not miss any doses however her vitamin B12 came down to 181.  She is likely not absorbing the oral B12. -We will start her on B12 injections monthly. -Labs on 06/11/2019 showed her vitamin B12 level increased to 351. - Labs done on 08/16/2019 showed her vitamin B 12 at 280 -She will receive her injection today.  She will also continue monthly B12 injections -We will check labs again at her next visit.

## 2019-08-24 ENCOUNTER — Inpatient Hospital Stay (HOSPITAL_COMMUNITY): Payer: Self-pay

## 2019-08-24 ENCOUNTER — Other Ambulatory Visit: Payer: Self-pay

## 2019-08-24 ENCOUNTER — Encounter (HOSPITAL_COMMUNITY): Payer: Self-pay

## 2019-08-24 VITALS — BP 120/51 | HR 96 | Temp 97.8°F | Resp 18

## 2019-08-24 DIAGNOSIS — D500 Iron deficiency anemia secondary to blood loss (chronic): Secondary | ICD-10-CM

## 2019-08-24 MED ORDER — SODIUM CHLORIDE 0.9 % IV SOLN
510.0000 mg | Freq: Once | INTRAVENOUS | Status: AC
  Administered 2019-08-24: 510 mg via INTRAVENOUS
  Filled 2019-08-24: qty 510

## 2019-08-24 MED ORDER — SODIUM CHLORIDE 0.9 % IV SOLN
Freq: Once | INTRAVENOUS | Status: AC
  Administered 2019-08-24: 13:00:00 via INTRAVENOUS

## 2019-08-24 NOTE — Progress Notes (Signed)
Feraheme given today per MD orders. Tolerated infusion without adverse affects. Vital signs stable. No complaints at this time. Pt requests discharge prior to 30 minute monitoring. Pt teaching performed. Understanding verbalized. Discharged from clinic ambulatory. F/U with [facility] as scheduled.

## 2019-08-31 ENCOUNTER — Encounter (HOSPITAL_COMMUNITY): Payer: Self-pay

## 2019-08-31 ENCOUNTER — Inpatient Hospital Stay (HOSPITAL_COMMUNITY): Payer: Self-pay

## 2019-08-31 ENCOUNTER — Other Ambulatory Visit: Payer: Self-pay

## 2019-08-31 VITALS — BP 130/49 | HR 89 | Temp 97.5°F | Resp 18

## 2019-08-31 DIAGNOSIS — D500 Iron deficiency anemia secondary to blood loss (chronic): Secondary | ICD-10-CM

## 2019-08-31 MED ORDER — SODIUM CHLORIDE 0.9 % IV SOLN
Freq: Once | INTRAVENOUS | Status: AC
  Administered 2019-08-31: 14:00:00 via INTRAVENOUS

## 2019-08-31 MED ORDER — SODIUM CHLORIDE 0.9 % IV SOLN
510.0000 mg | Freq: Once | INTRAVENOUS | Status: AC
  Administered 2019-08-31: 510 mg via INTRAVENOUS
  Filled 2019-08-31: qty 510

## 2019-08-31 NOTE — Progress Notes (Signed)
Amy Andersen tolerated Feraheme infusion well without complaints or incident. VSS upon discharge. Pt discharged self ambulatory in satisfactory condition

## 2019-08-31 NOTE — Patient Instructions (Signed)
[facility] at [facility] Discharge Instructions  Received Feraheme infusion today. Follow-up as scheduled. Call clinic for any questions or concerns   Thank you for choosing [facility] at [facility] to provide your oncology and hematology care.  To afford each patient quality time with our provider, please arrive at least 15 minutes before your scheduled appointment time.   If you have a lab appointment with the Cancer Center please come in thru the Main Entrance and check in at the main information desk.  You need to re-schedule your appointment should you arrive 10 or more minutes late.  We strive to give you quality time with our providers, and arriving late affects you and other patients whose appointments are after yours.  Also, if you no show three or more times for appointments you may be dismissed from the clinic at the providers discretion.     Again, thank you for choosing [facility].  Our hope is that these requests will decrease the amount of time that you wait before being seen by our physicians.       _____________________________________________________________  Should you have questions after your visit to  Cancer Center, please contact our office at [phone] between the hours of 8:00 a.m. and 4:30 p.m.  Voicemails left after 4:00 p.m. will not be returned until the following business day.  For prescription refill requests, have your pharmacy contact our office and allow 72 hours.    Due to Covid, you will need to wear a mask upon entering the hospital. If you do not have a mask, a mask will be given to you at the Main Entrance upon arrival. For doctor visits, patients may have 1 support person with them. For treatment visits, patients can not have anyone with them due to social distancing guidelines and our immunocompromised population.     

## 2019-09-17 ENCOUNTER — Encounter (HOSPITAL_COMMUNITY): Payer: Self-pay

## 2019-09-17 ENCOUNTER — Other Ambulatory Visit: Payer: Self-pay

## 2019-09-17 ENCOUNTER — Inpatient Hospital Stay (HOSPITAL_COMMUNITY): Payer: Self-pay | Attending: Internal Medicine

## 2019-09-17 ENCOUNTER — Ambulatory Visit (HOSPITAL_COMMUNITY): Payer: Medicaid Other

## 2019-09-17 VITALS — BP 135/69 | HR 88 | Temp 97.5°F | Resp 18

## 2019-09-17 DIAGNOSIS — N920 Excessive and frequent menstruation with regular cycle: Secondary | ICD-10-CM | POA: Insufficient documentation

## 2019-09-17 DIAGNOSIS — D500 Iron deficiency anemia secondary to blood loss (chronic): Secondary | ICD-10-CM | POA: Insufficient documentation

## 2019-09-17 DIAGNOSIS — E538 Deficiency of other specified B group vitamins: Secondary | ICD-10-CM | POA: Insufficient documentation

## 2019-09-17 MED ORDER — CYANOCOBALAMIN 1000 MCG/ML IJ SOLN
INTRAMUSCULAR | Status: AC
  Filled 2019-09-17: qty 1

## 2019-09-17 MED ORDER — CYANOCOBALAMIN 1000 MCG/ML IJ SOLN
1000.0000 ug | Freq: Once | INTRAMUSCULAR | Status: AC
  Administered 2019-09-17: 12:00:00 1000 ug via INTRAMUSCULAR

## 2019-09-17 NOTE — Progress Notes (Signed)
Patient tolerated injection with no complaints voiced.  Site clean and dry with no bruising or swelling noted at site.  Band aid applied.  Vss with discharge and left ambulatory with no s/s of distress noted.  

## 2019-10-18 ENCOUNTER — Inpatient Hospital Stay (HOSPITAL_COMMUNITY): Payer: Self-pay | Attending: Internal Medicine

## 2019-10-18 ENCOUNTER — Other Ambulatory Visit: Payer: Self-pay

## 2019-10-18 VITALS — BP 137/70 | HR 98 | Temp 98.4°F | Resp 18

## 2019-10-18 DIAGNOSIS — E538 Deficiency of other specified B group vitamins: Secondary | ICD-10-CM | POA: Insufficient documentation

## 2019-10-18 DIAGNOSIS — D500 Iron deficiency anemia secondary to blood loss (chronic): Secondary | ICD-10-CM

## 2019-10-18 MED ORDER — CYANOCOBALAMIN 1000 MCG/ML IJ SOLN
INTRAMUSCULAR | Status: AC
  Filled 2019-10-18: qty 1

## 2019-10-18 MED ORDER — CYANOCOBALAMIN 1000 MCG/ML IJ SOLN
1000.0000 ug | Freq: Once | INTRAMUSCULAR | Status: AC
  Administered 2019-10-18: 1000 ug via INTRAMUSCULAR
  Filled 2019-10-18: qty 1

## 2019-10-18 NOTE — Progress Notes (Signed)
Amy Andersen presents today for injection per MD orders. B12  administered IM in right deltoid. Administration without incident. Patient tolerated well.  No complaints at this time. Discharged from clinic ambulatory. F/U with [facility] as scheduled.

## 2019-10-18 NOTE — Patient Instructions (Signed)
[facility] at [facility]  Discharge Instructions:   _______________________________________________________________  Thank you for choosing [facility] at [facility] to provide your oncology and hematology care.  To afford each patient quality time with our providers, please arrive at least 15 minutes before your scheduled appointment.  You need to re-schedule your appointment if you arrive 10 or more minutes late.  We strive to give you quality time with our providers, and arriving late affects you and other patients whose appointments are after yours.  Also, if you no show three or more times for appointments you may be dismissed from the clinic.  Again, thank you for choosing [facility] at [facility]. Our hope is that these requests will allow you access to exceptional care and in a timely manner. _______________________________________________________________  If you have questions after your visit, please contact our office at [phone] between the hours of 8:30 a.m. and 5:00 p.m. Voicemails left after 4:30 p.m. will not be returned until the following business day. _______________________________________________________________  For prescription refill requests, have your pharmacy contact our office. _______________________________________________________________  Recommendations made by the consultant and any test results will be sent to your referring physician. _______________________________________________________________ 

## 2019-11-17 ENCOUNTER — Ambulatory Visit (HOSPITAL_COMMUNITY): Payer: Medicaid Other

## 2019-11-17 ENCOUNTER — Other Ambulatory Visit: Payer: Self-pay

## 2019-11-17 ENCOUNTER — Inpatient Hospital Stay (HOSPITAL_COMMUNITY): Payer: Medicaid Other | Attending: Hematology

## 2019-11-17 DIAGNOSIS — Z791 Long term (current) use of non-steroidal anti-inflammatories (NSAID): Secondary | ICD-10-CM | POA: Diagnosis not present

## 2019-11-17 DIAGNOSIS — F17210 Nicotine dependence, cigarettes, uncomplicated: Secondary | ICD-10-CM | POA: Insufficient documentation

## 2019-11-17 DIAGNOSIS — D500 Iron deficiency anemia secondary to blood loss (chronic): Secondary | ICD-10-CM | POA: Diagnosis not present

## 2019-11-17 DIAGNOSIS — E559 Vitamin D deficiency, unspecified: Secondary | ICD-10-CM | POA: Insufficient documentation

## 2019-11-17 DIAGNOSIS — N920 Excessive and frequent menstruation with regular cycle: Secondary | ICD-10-CM | POA: Diagnosis not present

## 2019-11-17 DIAGNOSIS — Z8249 Family history of ischemic heart disease and other diseases of the circulatory system: Secondary | ICD-10-CM | POA: Diagnosis not present

## 2019-11-17 DIAGNOSIS — Z79899 Other long term (current) drug therapy: Secondary | ICD-10-CM | POA: Diagnosis not present

## 2019-11-17 DIAGNOSIS — E538 Deficiency of other specified B group vitamins: Secondary | ICD-10-CM | POA: Insufficient documentation

## 2019-11-17 DIAGNOSIS — Z809 Family history of malignant neoplasm, unspecified: Secondary | ICD-10-CM | POA: Diagnosis not present

## 2019-11-17 LAB — CBC WITH DIFFERENTIAL/PLATELET
Abs Immature Granulocytes: 0.03 10*3/uL (ref 0.00–0.07)
Basophils Absolute: 0.1 10*3/uL (ref 0.0–0.1)
Basophils Relative: 1 %
Eosinophils Absolute: 0.2 10*3/uL (ref 0.0–0.5)
Eosinophils Relative: 2 %
HCT: 50.2 % — ABNORMAL HIGH (ref 36.0–46.0)
Hemoglobin: 16 g/dL — ABNORMAL HIGH (ref 12.0–15.0)
Immature Granulocytes: 0 %
Lymphocytes Relative: 24 %
Lymphs Abs: 2.5 10*3/uL (ref 0.7–4.0)
MCH: 30 pg (ref 26.0–34.0)
MCHC: 31.9 g/dL (ref 30.0–36.0)
MCV: 94.2 fL (ref 80.0–100.0)
Monocytes Absolute: 0.4 10*3/uL (ref 0.1–1.0)
Monocytes Relative: 4 %
Neutro Abs: 7.4 10*3/uL (ref 1.7–7.7)
Neutrophils Relative %: 69 %
Platelets: 348 10*3/uL (ref 150–400)
RBC: 5.33 MIL/uL — ABNORMAL HIGH (ref 3.87–5.11)
RDW: 14.7 % (ref 11.5–15.5)
WBC: 10.6 10*3/uL — ABNORMAL HIGH (ref 4.0–10.5)
nRBC: 0 % (ref 0.0–0.2)

## 2019-11-17 LAB — COMPREHENSIVE METABOLIC PANEL
ALT: 12 U/L (ref 0–44)
AST: 13 U/L — ABNORMAL LOW (ref 15–41)
Albumin: 4.3 g/dL (ref 3.5–5.0)
Alkaline Phosphatase: 79 U/L (ref 38–126)
Anion gap: 12 (ref 5–15)
BUN: 12 mg/dL (ref 6–20)
CO2: 24 mmol/L (ref 22–32)
Calcium: 9.7 mg/dL (ref 8.9–10.3)
Chloride: 103 mmol/L (ref 98–111)
Creatinine, Ser: 0.77 mg/dL (ref 0.44–1.00)
GFR calc Af Amer: >60 mL/min (ref >60)
GFR calc non Af Amer: >60 mL/min (ref >60)
Glucose, Bld: 115 mg/dL — ABNORMAL HIGH (ref 70–99)
Potassium: 3.5 mmol/L (ref 3.5–5.1)
Sodium: 139 mmol/L (ref 135–145)
Total Bilirubin: 0.4 mg/dL (ref 0.3–1.2)
Total Protein: 8 g/dL (ref 6.5–8.1)

## 2019-11-17 LAB — IRON AND TIBC
Iron: 85 ug/dL (ref 28–170)
Saturation Ratios: 19 % (ref 10.4–31.8)
TIBC: 449 ug/dL (ref 250–450)
UIBC: 364 ug/dL

## 2019-11-17 LAB — VITAMIN D 25 HYDROXY (VIT D DEFICIENCY, FRACTURES): Vit D, 25-Hydroxy: 14.59 ng/mL — ABNORMAL LOW (ref 30–100)

## 2019-11-17 LAB — VITAMIN B12: Vitamin B-12: 341 pg/mL (ref 180–914)

## 2019-11-17 LAB — LACTATE DEHYDROGENASE: LDH: 113 U/L (ref 98–192)

## 2019-11-17 LAB — FERRITIN: Ferritin: 8 ng/mL — ABNORMAL LOW (ref 11–307)

## 2019-11-18 ENCOUNTER — Inpatient Hospital Stay (HOSPITAL_COMMUNITY): Payer: Medicaid Other

## 2019-11-18 ENCOUNTER — Encounter (HOSPITAL_COMMUNITY): Payer: Self-pay | Admitting: Nurse Practitioner

## 2019-11-18 ENCOUNTER — Inpatient Hospital Stay (HOSPITAL_BASED_OUTPATIENT_CLINIC_OR_DEPARTMENT_OTHER): Payer: Medicaid Other | Admitting: Nurse Practitioner

## 2019-11-18 VITALS — BP 116/80 | HR 92 | Temp 97.5°F | Resp 16 | Wt 198.6 lb

## 2019-11-18 DIAGNOSIS — D500 Iron deficiency anemia secondary to blood loss (chronic): Secondary | ICD-10-CM

## 2019-11-18 DIAGNOSIS — E538 Deficiency of other specified B group vitamins: Secondary | ICD-10-CM | POA: Diagnosis not present

## 2019-11-18 DIAGNOSIS — E559 Vitamin D deficiency, unspecified: Secondary | ICD-10-CM

## 2019-11-18 MED ORDER — CYANOCOBALAMIN 1000 MCG/ML IJ SOLN
INTRAMUSCULAR | Status: AC
  Filled 2019-11-18: qty 1

## 2019-11-18 MED ORDER — ERGOCALCIFEROL 1.25 MG (50000 UT) PO CAPS: 50000.0000 [IU] | ORAL_CAPSULE | ORAL | 4 refills | Status: DC

## 2019-11-18 MED ORDER — CYANOCOBALAMIN 1000 MCG/ML IJ SOLN
1000.0000 ug | Freq: Once | INTRAMUSCULAR | Status: AC
  Administered 2019-11-18: 1000 ug via INTRAMUSCULAR

## 2019-11-18 NOTE — Progress Notes (Signed)
1205 Labs reviewed with and pt seen by RLockamy NP and pt approved for Vit B12 injection today per NP                                    Amy Andersen tolerated Vit B12 injection well without complaints or incident. Pt discharged self ambulatory in satisfactory condition

## 2019-11-18 NOTE — Patient Instructions (Signed)
[facility] at [facility] Discharge Instructions  Received Vit B12 injection today. Follow-up as scheduled. Call clinic for any questions or concerns   Thank you for choosing [facility] at [facility] to provide your oncology and hematology care.  To afford each patient quality time with our provider, please arrive at least 15 minutes before your scheduled appointment time.   If you have a lab appointment with the Cancer Center please come in thru the Main Entrance and check in at the main information desk.  You need to re-schedule your appointment should you arrive 10 or more minutes late.  We strive to give you quality time with our providers, and arriving late affects you and other patients whose appointments are after yours.  Also, if you no show three or more times for appointments you may be dismissed from the clinic at the providers discretion.     Again, thank you for choosing [facility].  Our hope is that these requests will decrease the amount of time that you wait before being seen by our physicians.       _____________________________________________________________  Should you have questions after your visit to [facility], please contact our office at [phone] between the hours of 8:00 a.m. and 4:30 p.m.  Voicemails left after 4:00 p.m. will not be returned until the following business day.  For prescription refill requests, have your pharmacy contact our office and allow 72 hours.    Due to Covid, you will need to wear a mask upon entering the hospital. If you do not have a mask, a mask will be given to you at the Main Entrance upon arrival. For doctor visits, patients may have 1 support person with them. For treatment visits, patients can not have anyone with them due to social distancing guidelines and our immunocompromised population.     

## 2019-11-18 NOTE — Progress Notes (Signed)
[facility] [address], [place] [address]   CLINIC:  Medical Oncology/Hematology  PCP:  Elfredia Nevins, MD [address] [place] [address] [phone]   REASON FOR VISIT: Follow-up for iron deficiency anemia  CURRENT THERAPY: Treatment iron infusions   INTERVAL HISTORY:  [name] 43 y.o. female returns for routine follow-up for iron deficiency anemia.  She reports she is still mildly fatigued.  She does have problems with sleeping at night and headaches.  She denies any bright red bleeding per rectum or melena. Denies any nausea, vomiting, or diarrhea. Denies any new pains. Had not noticed any recent bleeding such as epistaxis, hematuria or hematochezia. Denies recent chest pain on exertion, shortness of breath on minimal exertion, pre-syncopal episodes, or palpitations. Denies any numbness or tingling in hands or feet. Denies any recent fevers, infections, or recent hospitalizations. Patient reports appetite at 50% and energy level at 0%.  She is eating well maintain her weight at this time    REVIEW OF SYSTEMS:  Review of Systems  Constitutional: Positive for fatigue.  Neurological: Positive for dizziness and headaches.  Psychiatric/Behavioral: Positive for sleep disturbance.     PAST MEDICAL/SURGICAL HISTORY:  Past Medical History:  Diagnosis Date  . Chronic pain    Past Surgical History:  Procedure Laterality Date  . DILATION AND CURETTAGE OF UTERUS       SOCIAL HISTORY:  Social History   Socioeconomic History  . Marital status: Married    Spouse name: Amy Andersen  . Number of children: 3  . Years of education: 9  . Highest education level: 9th grade  Occupational History  . Not on file  Social Needs  . Financial resource strain: Somewhat hard  . Food insecurity    Worry: Often true    Inability: Often true  . Transportation needs    Medical: No    Non-medical: No  Tobacco Use  . Smoking status: Current Every Day  Smoker    Packs/day: 0.50    Types: Cigarettes  . Smokeless tobacco: Never Used  Substance and Sexual Activity  . Alcohol use: No  . Drug use: No  . Sexual activity: Yes    Birth control/protection: None  Lifestyle  . Physical activity    Days per week: 0 days    Minutes per session: Not on file  . Stress: Rather much  Relationships  . Social connections    Talks on phone: More than three times a week    Gets together: Twice a week    Attends religious service: Never    Active member of club or organization: No    Attends meetings of clubs or organizations: Never    Relationship status: Married  . Intimate partner violence    Fear of current or ex partner: No    Emotionally abused: No    Physically abused: No    Forced sexual activity: No  Other Topics Concern  . Not on file  Social History Narrative  . Not on file    FAMILY HISTORY:  Family History  Problem Relation Age of Onset  . Hypertension Maternal Grandmother   . CAD Maternal Grandmother   . Cancer Maternal Grandfather   . Hypertension Mother     CURRENT MEDICATIONS:  Outpatient Encounter Medications as of 11/18/2019  Medication Sig  . cyclobenzaprine (FLEXERIL) 10 MG tablet Take 10 mg by mouth 3 (three) times daily.  . ergocalciferol (VITAMIN D2) 1.25 MG (50000 UT) capsule Take 1  capsule (50,000 Units total) by mouth once a week.  Marland Kitchen HYDROcodone-acetaminophen (NORCO/VICODIN) 5-325 MG tablet Take 1 tablet by mouth every 6 (six) hours as needed for moderate pain (Must last 30 days.Do not take and drive a car or use machinery.).  Marland Kitchen naproxen (NAPROSYN) 500 MG tablet TAKE ONE TABLET BY MOUTH TWICE DAILY AFTER MEALS.   Facility-Administered Encounter Medications as of 11/18/2019  Medication  . cyanocobalamin ((VITAMIN B-12)) injection 1,000 mcg    ALLERGIES:  No Known Allergies   PHYSICAL EXAM:  ECOG Performance status: 1  Vitals:   11/18/19 1147  BP: 116/80  Pulse: 92  Resp: 16  Temp: (!) 97.5 F  (36.4 C)  SpO2: 99%   Filed Weights   11/18/19 1147  Weight: 198 lb 9.6 oz (90.1 kg)    Physical Exam Constitutional:      Appearance: Normal appearance. She is normal weight.  Cardiovascular:     Rate and Rhythm: Normal rate and regular rhythm.     Heart sounds: Normal heart sounds.  Pulmonary:     Effort: Pulmonary effort is normal.     Breath sounds: Normal breath sounds.  Abdominal:     General: Bowel sounds are normal.     Palpations: Abdomen is soft.  Musculoskeletal: Normal range of motion.  Skin:    General: Skin is warm.  Neurological:     Mental Status: She is alert and oriented to person, place, and time. Mental status is at baseline.  Psychiatric:        Mood and Affect: Mood normal.        Behavior: Behavior normal.        Thought Content: Thought content normal.        Judgment: Judgment normal.      LABORATORY DATA:  I have reviewed the labs as listed.  CBC    Component Value Date/Time   WBC 10.6 (H) 11/17/2019 0917   RBC 5.33 (H) 11/17/2019 0917   HGB 16.0 (H) 11/17/2019 0917   HCT 50.2 (H) 11/17/2019 0917   PLT 348 11/17/2019 0917   MCV 94.2 11/17/2019 0917   MCH 30.0 11/17/2019 0917   MCHC 31.9 11/17/2019 0917   RDW 14.7 11/17/2019 0917   LYMPHSABS 2.5 11/17/2019 0917   MONOABS 0.4 11/17/2019 0917   EOSABS 0.2 11/17/2019 0917   BASOSABS 0.1 11/17/2019 0917   CMP Latest Ref Rng & Units 11/17/2019 08/16/2019 06/11/2019  Glucose 70 - 99 mg/dL 115(H) 134(H) 109(H)  BUN 6 - 20 mg/dL 12 9 11   Creatinine 0.44 - 1.00 mg/dL 0.77 0.78 0.71  Sodium 135 - 145 mmol/L 139 138 137  Potassium 3.5 - 5.1 mmol/L 3.5 3.6 3.3(L)  Chloride 98 - 111 mmol/L 103 105 102  CO2 22 - 32 mmol/L 24 24 25   Calcium 8.9 - 10.3 mg/dL 9.7 9.3 9.2  Total Protein 6.5 - 8.1 g/dL 8.0 7.8 7.7  Total Bilirubin 0.3 - 1.2 mg/dL 0.4 0.3 0.5  Alkaline Phos 38 - 126 U/L 79 61 69  AST 15 - 41 U/L 13(L) 13(L) 13(L)  ALT 0 - 44 U/L 12 11 19       I personally performed a  face-to-face visit.  All questions were answered to patient's stated satisfaction. Encouraged patient to call with any new concerns or questions before his next visit to the cancer center and we can certain see him sooner, if needed.     ASSESSMENT & PLAN:   Iron deficiency anemia due to chronic blood  loss 1.  Severe iron deficiency anemia: -She was found to be severely anemic with a hemoglobin of 6.8 in March 2019. -She was started on oral iron therapy at that time.  Her hemoglobin on 03/27/2018 had improved to 8.9. - She is actively menstruating with it lasting 7 days every 28 days.  Her heaviest bleeding is for 2 days. -Last Feraheme infusion was on 09/02/2018 and 12/14/2018. -She denies any bright red bleeding per rectum or melena. -Labs on 06/11/2019 showed her hemoglobin 12.1, ferritin 5, percent saturation 5, platelets 345 -She would like to wait a few more weeks for iron infusions. -Labs on 11/17/2019 showed her hemoglobin 16.0, ferritin 8, percent saturation 19, platelets 348. -We will hold off on iron infusions at this time. -She will follow-up in 3 months with repeat labs.  2.  Vitamin B12 deficiency: - Her B12 was borderline low at 233. -She received 1 injection of vitamin B12 and was started on oral B12 1 mg tablet daily. - She reports she did not miss any doses however her vitamin B12 came down to 181.  She is likely not absorbing the oral B12. -We will start her on B12 injections monthly. -Labs on 06/11/2019 showed her vitamin B12 level increased to 351. - Labs done on 11/17/2019 showed her vitamin B 12 at 341 -She will receive her injection today.  She will also continue monthly B12 injections -We will check labs again at her next visit.  3.  Vitamin D deficiency -Labs done on 11/17/2019 showed her vitamin D level 14.59. -We will call her in a prescription for vitamin D 50,000 units weekly. -We will check her labs at her next visit.      Orders placed this encounter:   Orders Placed This Encounter  Procedures  . Lactate dehydrogenase  . CBC with Differential/Platelet  . Comprehensive metabolic panel  . Ferritin  . Iron and TIBC  . Vitamin B12  . Vitamin D 25 hydroxy  . Folate     Mathis Budandi Presly Steinruck, FNP-C [facility] [phone]

## 2019-11-18 NOTE — Assessment & Plan Note (Addendum)
1.  Severe iron deficiency anemia: -She was found to be severely anemic with a hemoglobin of 6.8 in March 2019. -She was started on oral iron therapy at that time.  Her hemoglobin on 03/27/2018 had improved to 8.9. - She is actively menstruating with it lasting 7 days every 28 days.  Her heaviest bleeding is for 2 days. -Last Feraheme infusion was on 09/02/2018 and 12/14/2018. -She denies any bright red bleeding per rectum or melena. -Labs on 06/11/2019 showed her hemoglobin 12.1, ferritin 5, percent saturation 5, platelets 345 -She would like to wait a few more weeks for iron infusions. -Labs on 11/17/2019 showed her hemoglobin 16.0, ferritin 8, percent saturation 19, platelets 348. -We will hold off on iron infusions at this time. -She will follow-up in 3 months with repeat labs.  2.  Vitamin B12 deficiency: - Her B12 was borderline low at 233. -She received 1 injection of vitamin B12 and was started on oral B12 1 mg tablet daily. - She reports she did not miss any doses however her vitamin B12 came down to 181.  She is likely not absorbing the oral B12. -We will start her on B12 injections monthly. -Labs on 06/11/2019 showed her vitamin B12 level increased to 351. - Labs done on 11/17/2019 showed her vitamin B 12 at 341 -She will receive her injection today.  She will also continue monthly B12 injections -We will check labs again at her next visit.  3.  Vitamin D deficiency -Labs done on 11/17/2019 showed her vitamin D level 14.59. -We will call her in a prescription for vitamin D 50,000 units weekly. -We will check her labs at her next visit.

## 2019-11-18 NOTE — Patient Instructions (Addendum)
[facility]  Discharge Instructions: Follow up in 3 months with labs   You saw Randi Lockamy, NP, today. _______________________________________________________________  Thank you for choosing [facility] to provide your oncology and hematology care.  To afford each patient quality time with our providers, please arrive at least 15 minutes before your scheduled appointment.  You need to re-schedule your appointment if you arrive 10 or more minutes late.  We strive to give you quality time with our providers, and arriving late affects you and other patients whose appointments are after yours.  Also, if you no show three or more times for appointments you may be dismissed from the clinic.  Again, thank you for choosing [facility]. Our hope is that these requests will allow you access to exceptional care and in a timely manner. _______________________________________________________________  If you have questions after your visit, please contact our office at [phone] between the hours of 8:30 a.m. and 5:00 p.m. Voicemails left after 4:30 p.m. will not be returned until the following business day. _______________________________________________________________  For prescription refill requests, have your pharmacy contact our office. _______________________________________________________________  Recommendations made by the consultant and any test results will be sent to your referring physician. _______________________________________________________________ 

## 2019-12-20 ENCOUNTER — Encounter (HOSPITAL_COMMUNITY): Payer: Self-pay

## 2019-12-20 ENCOUNTER — Other Ambulatory Visit: Payer: Self-pay

## 2019-12-20 ENCOUNTER — Inpatient Hospital Stay (HOSPITAL_COMMUNITY): Payer: Medicaid Other | Attending: Internal Medicine

## 2019-12-20 VITALS — BP 146/81 | HR 113 | Temp 97.5°F | Resp 18

## 2019-12-20 DIAGNOSIS — N920 Excessive and frequent menstruation with regular cycle: Secondary | ICD-10-CM | POA: Diagnosis not present

## 2019-12-20 DIAGNOSIS — E538 Deficiency of other specified B group vitamins: Secondary | ICD-10-CM | POA: Diagnosis not present

## 2019-12-20 DIAGNOSIS — D500 Iron deficiency anemia secondary to blood loss (chronic): Secondary | ICD-10-CM | POA: Diagnosis not present

## 2019-12-20 MED ORDER — CYANOCOBALAMIN 1000 MCG/ML IJ SOLN
1000.0000 ug | Freq: Once | INTRAMUSCULAR | Status: AC
  Administered 2019-12-20: 1000 ug via INTRAMUSCULAR

## 2020-01-20 ENCOUNTER — Inpatient Hospital Stay (HOSPITAL_COMMUNITY): Payer: Medicaid Other | Attending: Internal Medicine

## 2020-01-20 ENCOUNTER — Encounter (HOSPITAL_COMMUNITY): Payer: Self-pay

## 2020-01-20 ENCOUNTER — Other Ambulatory Visit: Payer: Self-pay

## 2020-01-20 VITALS — BP 151/67 | HR 82 | Temp 97.1°F | Resp 18

## 2020-01-20 DIAGNOSIS — E538 Deficiency of other specified B group vitamins: Secondary | ICD-10-CM | POA: Insufficient documentation

## 2020-01-20 DIAGNOSIS — D500 Iron deficiency anemia secondary to blood loss (chronic): Secondary | ICD-10-CM

## 2020-01-20 MED ORDER — CYANOCOBALAMIN 1000 MCG/ML IJ SOLN
1000.0000 ug | Freq: Once | INTRAMUSCULAR | Status: AC
  Administered 2020-01-20: 1000 ug via INTRAMUSCULAR
  Filled 2020-01-20: qty 1

## 2020-01-20 NOTE — Progress Notes (Signed)
Patient tolerated injection with no complaints voiced.  Site clean and dry with no bruising or swelling noted at site.  Band aid applied.  Vss with discharge and left ambulatory with no s/s of distress noted.  

## 2020-02-10 ENCOUNTER — Inpatient Hospital Stay (HOSPITAL_COMMUNITY): Payer: Medicaid Other

## 2020-02-10 ENCOUNTER — Other Ambulatory Visit: Payer: Self-pay

## 2020-02-10 DIAGNOSIS — D500 Iron deficiency anemia secondary to blood loss (chronic): Secondary | ICD-10-CM

## 2020-02-10 DIAGNOSIS — E538 Deficiency of other specified B group vitamins: Secondary | ICD-10-CM | POA: Diagnosis not present

## 2020-02-10 LAB — COMPREHENSIVE METABOLIC PANEL
ALT: 18 U/L (ref 0–44)
AST: 18 U/L (ref 15–41)
Albumin: 3.9 g/dL (ref 3.5–5.0)
Alkaline Phosphatase: 69 U/L (ref 38–126)
Anion gap: 9 (ref 5–15)
BUN: 9 mg/dL (ref 6–20)
CO2: 27 mmol/L (ref 22–32)
Calcium: 9.1 mg/dL (ref 8.9–10.3)
Chloride: 101 mmol/L (ref 98–111)
Creatinine, Ser: 0.75 mg/dL (ref 0.44–1.00)
GFR calc Af Amer: >60 mL/min (ref >60)
GFR calc non Af Amer: >60 mL/min (ref >60)
Glucose, Bld: 110 mg/dL — ABNORMAL HIGH (ref 70–99)
Potassium: 3.3 mmol/L — ABNORMAL LOW (ref 3.5–5.1)
Sodium: 137 mmol/L (ref 135–145)
Total Bilirubin: 0.5 mg/dL (ref 0.3–1.2)
Total Protein: 7.7 g/dL (ref 6.5–8.1)

## 2020-02-10 LAB — CBC WITH DIFFERENTIAL/PLATELET
Abs Immature Granulocytes: 0.04 10*3/uL (ref 0.00–0.07)
Basophils Absolute: 0.1 10*3/uL (ref 0.0–0.1)
Basophils Relative: 1 %
Eosinophils Absolute: 0.3 10*3/uL (ref 0.0–0.5)
Eosinophils Relative: 4 %
HCT: 37 % (ref 36.0–46.0)
Hemoglobin: 11.6 g/dL — ABNORMAL LOW (ref 12.0–15.0)
Immature Granulocytes: 0 %
Lymphocytes Relative: 27 %
Lymphs Abs: 2.5 10*3/uL (ref 0.7–4.0)
MCH: 29.6 pg (ref 26.0–34.0)
MCHC: 31.4 g/dL (ref 30.0–36.0)
MCV: 94.4 fL (ref 80.0–100.0)
Monocytes Absolute: 0.6 10*3/uL (ref 0.1–1.0)
Monocytes Relative: 7 %
Neutro Abs: 5.6 10*3/uL (ref 1.7–7.7)
Neutrophils Relative %: 61 %
Platelets: 438 10*3/uL — ABNORMAL HIGH (ref 150–400)
RBC: 3.92 MIL/uL (ref 3.87–5.11)
RDW: 12.9 % (ref 11.5–15.5)
WBC: 9.2 10*3/uL (ref 4.0–10.5)
nRBC: 0 % (ref 0.0–0.2)

## 2020-02-10 LAB — VITAMIN B12: Vitamin B-12: 414 pg/mL (ref 180–914)

## 2020-02-10 LAB — FOLATE: Folate: 12.6 ng/mL (ref >5.9)

## 2020-02-10 LAB — IRON AND TIBC
Iron: 25 ug/dL — ABNORMAL LOW (ref 28–170)
Saturation Ratios: 5 % — ABNORMAL LOW (ref 10.4–31.8)
TIBC: 460 ug/dL — ABNORMAL HIGH (ref 250–450)
UIBC: 435 ug/dL

## 2020-02-10 LAB — LACTATE DEHYDROGENASE: LDH: 113 U/L (ref 98–192)

## 2020-02-10 LAB — VITAMIN D 25 HYDROXY (VIT D DEFICIENCY, FRACTURES): Vit D, 25-Hydroxy: 62.33 ng/mL (ref 30–100)

## 2020-02-10 LAB — FERRITIN: Ferritin: 7 ng/mL — ABNORMAL LOW (ref 11–307)

## 2020-02-17 ENCOUNTER — Other Ambulatory Visit: Payer: Self-pay

## 2020-02-17 ENCOUNTER — Inpatient Hospital Stay (HOSPITAL_COMMUNITY): Payer: Medicaid Other

## 2020-02-17 ENCOUNTER — Inpatient Hospital Stay (HOSPITAL_COMMUNITY): Payer: Medicaid Other | Attending: Internal Medicine | Admitting: Nurse Practitioner

## 2020-02-17 DIAGNOSIS — G479 Sleep disorder, unspecified: Secondary | ICD-10-CM | POA: Insufficient documentation

## 2020-02-17 DIAGNOSIS — Z791 Long term (current) use of non-steroidal anti-inflammatories (NSAID): Secondary | ICD-10-CM | POA: Diagnosis not present

## 2020-02-17 DIAGNOSIS — R5383 Other fatigue: Secondary | ICD-10-CM | POA: Insufficient documentation

## 2020-02-17 DIAGNOSIS — D500 Iron deficiency anemia secondary to blood loss (chronic): Secondary | ICD-10-CM | POA: Diagnosis present

## 2020-02-17 DIAGNOSIS — E538 Deficiency of other specified B group vitamins: Secondary | ICD-10-CM | POA: Diagnosis not present

## 2020-02-17 DIAGNOSIS — E559 Vitamin D deficiency, unspecified: Secondary | ICD-10-CM | POA: Insufficient documentation

## 2020-02-17 DIAGNOSIS — F17210 Nicotine dependence, cigarettes, uncomplicated: Secondary | ICD-10-CM | POA: Diagnosis not present

## 2020-02-17 DIAGNOSIS — N920 Excessive and frequent menstruation with regular cycle: Secondary | ICD-10-CM | POA: Insufficient documentation

## 2020-02-17 DIAGNOSIS — Z79899 Other long term (current) drug therapy: Secondary | ICD-10-CM | POA: Insufficient documentation

## 2020-02-17 MED ORDER — DENOSUMAB 120 MG/1.7ML ~~LOC~~ SOLN
SUBCUTANEOUS | Status: AC
  Filled 2020-02-17: qty 1.7

## 2020-02-17 MED ORDER — CYANOCOBALAMIN 1000 MCG/ML IJ SOLN
1000.0000 ug | Freq: Once | INTRAMUSCULAR | Status: AC
  Administered 2020-02-17: 1000 ug via INTRAMUSCULAR
  Filled 2020-02-17: qty 1

## 2020-02-17 MED ORDER — DENOSUMAB 60 MG/ML ~~LOC~~ SOSY
PREFILLED_SYRINGE | SUBCUTANEOUS | Status: AC
  Filled 2020-02-17: qty 1

## 2020-02-17 NOTE — Patient Instructions (Signed)
[facility]  Discharge Instructions:  B12 injection received today. _______________________________________________________________  Thank you for choosing [facility] to provide your oncology and hematology care.  To afford each patient quality time with our providers, please arrive at least 15 minutes before your scheduled appointment.  You need to re-schedule your appointment if you arrive 10 or more minutes late.  We strive to give you quality time with our providers, and arriving late affects you and other patients whose appointments are after yours.  Also, if you no show three or more times for appointments you may be dismissed from the clinic.  Again, thank you for choosing [facility]. Our hope is that these requests will allow you access to exceptional care and in a timely manner. _______________________________________________________________  If you have questions after your visit, please contact our office at [phone] between the hours of 8:30 a.m. and 5:00 p.m. Voicemails left after 4:30 p.m. will not be returned until the following business day. _______________________________________________________________  For prescription refill requests, have your pharmacy contact our office. _______________________________________________________________  Recommendations made by the consultant and any test results will be sent to your referring physician. _______________________________________________________________ 

## 2020-02-17 NOTE — Progress Notes (Signed)
[facility] [address], [place] [address]   CLINIC:  Medical Oncology/Hematology  PCP:  Elfredia Nevins, MD [address] [place] [address] [phone]   REASON FOR VISIT: Follow-up for iron deficiency anemia  CURRENT THERAPY: Intermittent iron infusions   INTERVAL HISTORY:  [name] 44 y.o. female returns for routine follow-up for iron deficiency anemia.  Patient reports she is slightly more fatigued now.  She reports her periods have been extremely heavy the past couple months.  She denies any bright red bleeding per rectum or melena. Denies any nausea, vomiting, or diarrhea. Denies any new pains. Had not noticed any recent bleeding such as epistaxis, hematuria or hematochezia. Denies recent chest pain on exertion, shortness of breath on minimal exertion, pre-syncopal episodes, or palpitations. Denies any numbness or tingling in hands or feet. Denies any recent fevers, infections, or recent hospitalizations. Patient reports appetite at 50% and energy level at 0%.  She is eating well and maintaining her weight.    REVIEW OF SYSTEMS:  Review of Systems  Constitutional: Positive for fatigue.  Genitourinary: Positive for menstrual problem (Heavy periods) and vaginal bleeding.   Psychiatric/Behavioral: Positive for sleep disturbance.     PAST MEDICAL/SURGICAL HISTORY:  Past Medical History:  Diagnosis Date  . Chronic pain    Past Surgical History:  Procedure Laterality Date  . DILATION AND CURETTAGE OF UTERUS       SOCIAL HISTORY:  Social History   Socioeconomic History  . Marital status: Married    Spouse name: Tasia Catchings  . Number of children: 3  . Years of education: 9  . Highest education level: 9th grade  Occupational History  . Not on file  Tobacco Use  . Smoking status: Current Every Day Smoker    Packs/day: 0.50    Types: Cigarettes  . Smokeless tobacco: Never Used  Substance and Sexual Activity  . Alcohol use: No  .  Drug use: No  . Sexual activity: Yes    Birth control/protection: None  Other Topics Concern  . Not on file  Social History Narrative  . Not on file   Social Determinants of Health   Financial Resource Strain:   . Difficulty of Paying Living Expenses: Not on file  Food Insecurity:   . Worried About Programme researcher, broadcasting/film/video in the Last Year: Not on file  . Ran Out of Food in the Last Year: Not on file  Transportation Needs:   . Lack of Transportation (Medical): Not on file  . Lack of Transportation (Non-Medical): Not on file  Physical Activity:   . Days of Exercise per Week: Not on file  . Minutes of Exercise per Session: Not on file  Stress:   . Feeling of Stress : Not on file  Social Connections:   . Frequency of Communication with Friends and Family: Not on file  . Frequency of Social Gatherings with Friends and Family: Not on file  . Attends Religious Services: Not on file  . Active Member of Clubs or Organizations: Not on file  . Attends Banker Meetings: Not on file  . Marital Status: Not on file  Intimate Partner Violence:   . Fear of Current or Ex-Partner: Not on file  . Emotionally Abused: Not on file  . Physically Abused: Not on file  . Sexually Abused: Not on file    FAMILY HISTORY:  Family History  Problem Relation Age of Onset  . Hypertension Maternal Grandmother   .  CAD Maternal Grandmother   . Cancer Maternal Grandfather   . Hypertension Mother     CURRENT MEDICATIONS:  Outpatient Encounter Medications as of 02/17/2020  Medication Sig  . cyclobenzaprine (FLEXERIL) 10 MG tablet Take 10 mg by mouth 3 (three) times daily.  . ergocalciferol (VITAMIN D2) 1.25 MG (50000 UT) capsule Take 1 capsule (50,000 Units total) by mouth once a week.  . naproxen (NAPROSYN) 500 MG tablet TAKE ONE TABLET BY MOUTH TWICE DAILY AFTER MEALS.  . HYDROcodone-acetaminophen (NORCO/VICODIN) 5-325 MG tablet Take 1 tablet by mouth every 6 (six) hours as needed for moderate  pain (Must last 30 days.Do not take and drive a car or use machinery.). (Patient not taking: Reported on 02/17/2020)   No facility-administered encounter medications on file as of 02/17/2020.    ALLERGIES:  No Known Allergies   PHYSICAL EXAM:  ECOG Performance status: 1  Vitals:   02/17/20 1048  BP: 134/73  Pulse: 99  Resp: 18  Temp: (!) 97.3 F (36.3 C)  SpO2: 99%   Filed Weights   02/17/20 1048  Weight: 201 lb 14.4 oz (91.6 kg)    Physical Exam Constitutional:      Appearance: Normal appearance. She is normal weight.  Cardiovascular:     Rate and Rhythm: Normal rate and regular rhythm.     Heart sounds: Normal heart sounds.  Pulmonary:     Effort: Pulmonary effort is normal.     Breath sounds: Normal breath sounds.  Abdominal:     General: Bowel sounds are normal.     Palpations: Abdomen is soft.  Musculoskeletal:        General: Normal range of motion.  Skin:    General: Skin is warm.  Neurological:     Mental Status: She is alert and oriented to person, place, and time. Mental status is at baseline.  Psychiatric:        Mood and Affect: Mood normal.        Behavior: Behavior normal.        Thought Content: Thought content normal.        Judgment: Judgment normal.      LABORATORY DATA:  I have reviewed the labs as listed.  CBC    Component Value Date/Time   WBC 9.2 02/10/2020 1052   RBC 3.92 02/10/2020 1052   HGB 11.6 (L) 02/10/2020 1052   HCT 37.0 02/10/2020 1052   PLT 438 (H) 02/10/2020 1052   MCV 94.4 02/10/2020 1052   MCH 29.6 02/10/2020 1052   MCHC 31.4 02/10/2020 1052   RDW 12.9 02/10/2020 1052   LYMPHSABS 2.5 02/10/2020 1052   MONOABS 0.6 02/10/2020 1052   EOSABS 0.3 02/10/2020 1052   BASOSABS 0.1 02/10/2020 1052   CMP Latest Ref Rng & Units 02/10/2020 11/17/2019 08/16/2019  Glucose 70 - 99 mg/dL 782(N) 562(Z) 308(M)  BUN 6 - 20 mg/dL 9 12 9   Creatinine 0.44 - 1.00 mg/dL 5.78 4.69  Sodium 135 - 145 mmol/L 137 139 138  Potassium  3.5 - 5.1 mmol/L 3.3(L) 3.5 3.6  Chloride 98 - 111 mmol/L 101 103 105  CO2 22 - 32 mmol/L 27 24 24   Calcium 8.9 - 10.3 mg/dL 9.1 9.7 9.3  Total Protein 6.5 - 8.1 g/dL 7.7 8.0 7.8  Total Bilirubin 0.3 - 1.2 mg/dL 0.5 0.4 0.3  Alkaline Phos 38 - 126 U/L 69 79 61  AST 15 - 41 U/L 18 13(L) 13(L)  ALT 0 - 44 U/L 18 12 11  I personally performed a face-to-face visit.  All questions were answered to patient's stated satisfaction. Encouraged patient to call with any new concerns or questions before his next visit to the cancer center and we can certain see him sooner, if needed.     ASSESSMENT & PLAN:   Iron deficiency anemia due to chronic blood loss 1.  Severe iron deficiency anemia: -She was found to be severely anemic with a hemoglobin of 6.8 in March 2019. -She was started on oral iron therapy at that time.  Her hemoglobin on 03/27/2018 had improved to 8.9. - She is actively menstruating with it lasting 7 days every 28 days.  Her heaviest bleeding is for 2 days. -Last Feraheme infusion was on 08/24/2019 and 08/31/2019 -She denies any bright red bleeding per rectum or melena. -Labs on 11/17/2019 showed her hemoglobin 16.0, ferritin 8, percent saturation 19, platelets 348. -Labs done on 02/10/2020 showed her hemoglobin has dropped to 11.6.  Ferritin is 7 and percent saturation 5, platelets 438. -She reports she had extremely heavy period last week.  Also she is no longer taking her oral iron supplements due to upset stomach. -We will order 2 infusions of IV iron. -She will follow-up in 3 months with repeat labs.  2.  Vitamin B12 deficiency: - Her B12 was borderline low at 233. -She received 1 injection of vitamin B12 and was started on oral B12 1 mg tablet daily. - She reports she did not miss any doses however her vitamin B12 came down to 181.  She is likely not absorbing the oral B12. -We will start her on B12 injections monthly. -Labs on 06/11/2019 showed her vitamin B12 level increased  to 351. - Labs done on 02/10/2020 showed her vitamin B 12 at 414 -She will receive her injection today.  She will also continue monthly B12 injections -We will check labs again at her next visit.  3.  Vitamin D deficiency -Labs done on 02/10/2020 showed her vitamin D level 62.33 -She will continue to take vitamin D 2000 units weekly. -We will check her labs at her next visit.      Orders placed this encounter:  Orders Placed This Encounter  Procedures  . Lactate dehydrogenase  . CBC with Differential/Platelet  . Comprehensive metabolic panel  . Ferritin  . Iron and TIBC  . Vitamin B12  . VITAMIN D 25 Hydroxy (Vit-D Deficiency, Fractures)  . Folate      Francene Finders, FNP-C [place] [phone]

## 2020-02-17 NOTE — Progress Notes (Signed)
Amy Andersen presents today for B12 injection. VSS. Injection tolerated without incident or complaint. See MAR for details. Patient discharged in satisfactory condition with follow up instructions.

## 2020-02-17 NOTE — Patient Instructions (Signed)
[facility] Discharge Instructions  Follow up in 3 months with lab s   Thank you for choosing  [facility] at [facility] to provide your oncology and hematology care.  To afford each patient quality time with our provider, please arrive at least 15 minutes before your scheduled appointment time.   If you have a lab appointment with the Cancer Center please come in thru the Main Entrance and check in at the main information desk.  You need to re-schedule your appointment should you arrive 10 or more minutes late.  We strive to give you quality time with our providers, and arriving late affects you and other patients whose appointments are after yours.  Also, if you no show three or more times for appointments you may be dismissed from the clinic at the providers discretion.     Again, thank you for choosing [facility].  Our hope is that these requests will decrease the amount of time that you wait before being seen by our physicians.       _____________________________________________________________  Should you have questions after your visit to [facility], please contact our office at [phone] between the hours of 8:00 a.m. and 4:30 p.m.  Voicemails left after 4:00 p.m. will not be returned until the following business day.  For prescription refill requests, have your pharmacy contact our office and allow 72 hours.    Due to Covid, you will need to wear a mask upon entering the hospital. If you do not have a mask, a mask will be given to you at the Main Entrance upon arrival. For doctor visits, patients may have 1 support person with them. For treatment visits, patients can not have anyone with them due to social distancing guidelines and our immunocompromised population.      

## 2020-02-17 NOTE — Assessment & Plan Note (Addendum)
1.  Severe iron deficiency anemia: -She was found to be severely anemic with a hemoglobin of 6.8 in March 2019. -She was started on oral iron therapy at that time.  Her hemoglobin on 03/27/2018 had improved to 8.9. - She is actively menstruating with it lasting 7 days every 28 days.  Her heaviest bleeding is for 2 days. -Last Feraheme infusion was on 08/24/2019 and 08/31/2019 -She denies any bright red bleeding per rectum or melena. -Labs on 11/17/2019 showed her hemoglobin 16.0, ferritin 8, percent saturation 19, platelets 348. -Labs done on 02/10/2020 showed her hemoglobin has dropped to 11.6.  Ferritin is 7 and percent saturation 5, platelets 438. -She reports she had extremely heavy period last week.  Also she is no longer taking her oral iron supplements due to upset stomach. -We will order 2 infusions of IV iron. -She will follow-up in 3 months with repeat labs.  2.  Vitamin B12 deficiency: - Her B12 was borderline low at 233. -She received 1 injection of vitamin B12 and was started on oral B12 1 mg tablet daily. - She reports she did not miss any doses however her vitamin B12 came down to 181.  She is likely not absorbing the oral B12. -We will start her on B12 injections monthly. -Labs on 06/11/2019 showed her vitamin B12 level increased to 351. - Labs done on 02/10/2020 showed her vitamin B 12 at 414 -She will receive her injection today.  She will also continue monthly B12 injections -We will check labs again at her next visit.  3.  Vitamin D deficiency -Labs done on 02/10/2020 showed her vitamin D level 62.33 -She will continue to take vitamin D 2000 units weekly. -We will check her labs at her next visit.

## 2020-02-24 ENCOUNTER — Inpatient Hospital Stay (HOSPITAL_COMMUNITY): Payer: Medicaid Other

## 2020-02-24 ENCOUNTER — Encounter (HOSPITAL_COMMUNITY): Payer: Self-pay

## 2020-02-24 ENCOUNTER — Other Ambulatory Visit: Payer: Self-pay

## 2020-02-24 VITALS — BP 140/74 | HR 81 | Temp 98.1°F | Resp 18

## 2020-02-24 DIAGNOSIS — D500 Iron deficiency anemia secondary to blood loss (chronic): Secondary | ICD-10-CM

## 2020-02-24 MED ORDER — SODIUM CHLORIDE 0.9 % IV SOLN: INTRAVENOUS | Status: DC

## 2020-02-24 MED ORDER — SODIUM CHLORIDE 0.9 % IV SOLN
510.0000 mg | Freq: Once | INTRAVENOUS | Status: AC
  Administered 2020-02-24: 510 mg via INTRAVENOUS
  Filled 2020-02-24: qty 510

## 2020-02-24 NOTE — Patient Instructions (Signed)
[facility] at [facility]  Discharge Instructions:   _______________________________________________________________  Thank you for choosing [facility] at [facility] to provide your oncology and hematology care.  To afford each patient quality time with our providers, please arrive at least 15 minutes before your scheduled appointment.  You need to re-schedule your appointment if you arrive 10 or more minutes late.  We strive to give you quality time with our providers, and arriving late affects you and other patients whose appointments are after yours.  Also, if you no show three or more times for appointments you may be dismissed from the clinic.  Again, thank you for choosing [facility] at [facility]. Our hope is that these requests will allow you access to exceptional care and in a timely manner. _______________________________________________________________  If you have questions after your visit, please contact our office at [phone] between the hours of 8:30 a.m. and 5:00 p.m. Voicemails left after 4:30 p.m. will not be returned until the following business day. _______________________________________________________________  For prescription refill requests, have your pharmacy contact our office. _______________________________________________________________  Recommendations made by the consultant and any test results will be sent to your referring physician. _______________________________________________________________ 

## 2020-02-24 NOTE — Progress Notes (Signed)
   Patient presents today for Feraheme infusion. Vital signs stable. MAR reviewed and updated. Patient has no complaints of any changes since her last visit.       Ferahme given today per MD orders. Patient requests to leave prior to her 30 minute wait time. Patient teaching performed. Understanding verbalized. Tolerated infusion without adverse affects. Vital signs stable. No complaints at this time. Discharged from clinic ambulatory. F/U with [facility] as scheduled.

## 2020-03-02 ENCOUNTER — Inpatient Hospital Stay (HOSPITAL_COMMUNITY): Payer: Medicaid Other

## 2020-03-02 ENCOUNTER — Other Ambulatory Visit: Payer: Self-pay

## 2020-03-02 ENCOUNTER — Encounter (HOSPITAL_COMMUNITY): Payer: Self-pay

## 2020-03-02 VITALS — BP 140/89 | HR 100 | Temp 97.1°F | Resp 18

## 2020-03-02 DIAGNOSIS — D500 Iron deficiency anemia secondary to blood loss (chronic): Secondary | ICD-10-CM | POA: Diagnosis not present

## 2020-03-02 MED ORDER — SODIUM CHLORIDE 0.9 % IV SOLN
510.0000 mg | Freq: Once | INTRAVENOUS | Status: AC
  Administered 2020-03-02: 510 mg via INTRAVENOUS
  Filled 2020-03-02: qty 510

## 2020-03-02 MED ORDER — SODIUM CHLORIDE 0.9 % IV SOLN: INTRAVENOUS | Status: DC

## 2020-03-02 NOTE — Patient Instructions (Signed)
[facility] at [facility]  Discharge Instructions:   _______________________________________________________________  Thank you for choosing [facility] at [facility] to provide your oncology and hematology care.  To afford each patient quality time with our providers, please arrive at least 15 minutes before your scheduled appointment.  You need to re-schedule your appointment if you arrive 10 or more minutes late.  We strive to give you quality time with our providers, and arriving late affects you and other patients whose appointments are after yours.  Also, if you no show three or more times for appointments you may be dismissed from the clinic.  Again, thank you for choosing [facility] at [facility]. Our hope is that these requests will allow you access to exceptional care and in a timely manner. _______________________________________________________________  If you have questions after your visit, please contact our office at [phone] between the hours of 8:30 a.m. and 5:00 p.m. Voicemails left after 4:30 p.m. will not be returned until the following business day. _______________________________________________________________  For prescription refill requests, have your pharmacy contact our office. _______________________________________________________________  Recommendations made by the consultant and any test results will be sent to your referring physician. _______________________________________________________________ 

## 2020-03-02 NOTE — Progress Notes (Signed)
Patient presents today for Feraheme infusion. MAR reviewed and updated. Patient has no complaints of any changes since his last visit.   Feraheme given today per MD orders. Tolerated infusion without adverse affects. Vital signs stable. No complaints at this time. Discharged from clinic ambulatory. F/U with  Cancer Center as scheduled.  

## 2020-03-20 ENCOUNTER — Inpatient Hospital Stay (HOSPITAL_COMMUNITY): Payer: Medicaid Other | Attending: Internal Medicine

## 2020-03-20 ENCOUNTER — Encounter (HOSPITAL_COMMUNITY): Payer: Self-pay

## 2020-03-20 ENCOUNTER — Other Ambulatory Visit: Payer: Self-pay

## 2020-03-20 VITALS — BP 147/96 | HR 83 | Temp 97.7°F | Resp 18

## 2020-03-20 DIAGNOSIS — D500 Iron deficiency anemia secondary to blood loss (chronic): Secondary | ICD-10-CM | POA: Diagnosis present

## 2020-03-20 DIAGNOSIS — N920 Excessive and frequent menstruation with regular cycle: Secondary | ICD-10-CM | POA: Insufficient documentation

## 2020-03-20 DIAGNOSIS — E538 Deficiency of other specified B group vitamins: Secondary | ICD-10-CM | POA: Insufficient documentation

## 2020-03-20 MED ORDER — CYANOCOBALAMIN 1000 MCG/ML IJ SOLN
1000.0000 ug | Freq: Once | INTRAMUSCULAR | Status: AC
  Administered 2020-03-20: 1000 ug via INTRAMUSCULAR

## 2020-04-10 ENCOUNTER — Ambulatory Visit (INDEPENDENT_AMBULATORY_CARE_PROVIDER_SITE_OTHER): Payer: Medicaid Other | Admitting: Adult Health

## 2020-04-10 ENCOUNTER — Other Ambulatory Visit: Payer: Self-pay

## 2020-04-10 ENCOUNTER — Encounter: Payer: Self-pay | Admitting: Adult Health

## 2020-04-10 ENCOUNTER — Other Ambulatory Visit (HOSPITAL_COMMUNITY)
Admission: RE | Admit: 2020-04-10 | Discharge: 2020-04-10 | Disposition: A | Discharge status: Home / Self Care | Payer: Medicaid Other | Source: Ambulatory Visit | Attending: Adult Health | Admitting: Adult Health

## 2020-04-10 VITALS — BP 128/83 | HR 95 | Ht 67.0 in | Wt 202.0 lb

## 2020-04-10 DIAGNOSIS — Z1212 Encounter for screening for malignant neoplasm of rectum: Secondary | ICD-10-CM

## 2020-04-10 DIAGNOSIS — Z0000 Encounter for general adult medical examination without abnormal findings: Secondary | ICD-10-CM

## 2020-04-10 DIAGNOSIS — Z01419 Encounter for gynecological examination (general) (routine) without abnormal findings: Secondary | ICD-10-CM

## 2020-04-10 DIAGNOSIS — Z1151 Encounter for screening for human papillomavirus (HPV): Secondary | ICD-10-CM | POA: Diagnosis not present

## 2020-04-10 DIAGNOSIS — Z1211 Encounter for screening for malignant neoplasm of colon: Secondary | ICD-10-CM | POA: Diagnosis not present

## 2020-04-10 LAB — HEMOCCULT GUIAC POC 1CARD (OFFICE): Fecal Occult Blood, POC: NEGATIVE

## 2020-04-10 NOTE — Progress Notes (Signed)
Patient ID: RAYSHAWN VISCONTI, female   DOB: 1976-06-16, 44 y.o.   MRN: [id number] History of Present Illness: Amy Andersen is a 44 year old white female,married, [id number], in for well woman gyn exam and pap.She says it has been over 16 years, since last pap. 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 PCP is Dr Sherwood Gambler.   Current Medications, Allergies, Past Medical History, Past Surgical History, Family History and Social History were reviewed in [employer] electronic medical record.     Review of Systems: Patient denies any headaches, hearing loss, fatigue, blurred vision, shortness of breath, chest pain, abdominal pain, problems with bowel movements, urination, or intercourse. No joint pain or mood swings. Periods regular, not on any birth control, and is a smoker.    Physical Exam:BP 128/83 (BP Location: Left Arm, Patient Position: Sitting, Cuff Size: Normal)   Pulse 95   Ht 5\' 7"  (1.702 m)   Wt 202 lb (91.6 kg)   LMP 03/14/2020 (Exact Date)   BMI 31.64 kg/m  General:  Well developed, well nourished, no acute distress Skin:  Warm and dry,hs over 40 tattoos Neck:  Midline trachea, normal thyroid, good ROM, no lymphadenopathy Lungs; Clear to auscultation bilaterally Breast:  No dominant palpable mass, retraction, or nipple discharge Cardiovascular: Regular rate and rhythm Abdomen:  Soft, non tender, no hepatosplenomegaly Pelvic:  External genitalia is normal in appearance, no lesions.  The vagina is normal in appearance. Urethra has no lesions or masses. The cervix is bulbous, and smooth, pap with high risk HPV 16/18 genotyping performed.03/16/2020  Uterus is felt to be normal size, shape, and contour.  No adnexal masses or tenderness noted.Bladder is non tender, no masses felt. Rectal: Good sphincter tone, no polyps, or hemorrhoids felt.  Hemoccult negative. Extremities/musculoskeletal:  No swelling or varicosities noted, no clubbing or cyanosis Psych:  No mood changes, alert and cooperative,seems  happy AA is 0 Fall risk is low PHQ 9 score is 0. Examination chaperoned by Marland Kitchen LPN  Impression and Plan: 1. Encounter for gynecological examination with Papanicolaou smear of cervix Pap sent Physical in 1 year Pap in 3 if normal  Get mammogram now Labs with PCP  Decease smoking  2. Screening for colorectal cancer

## 2020-04-12 LAB — CYTOLOGY - PAP
Comment: NEGATIVE
Diagnosis: NEGATIVE
High risk HPV: NEGATIVE

## 2020-04-19 ENCOUNTER — Encounter (HOSPITAL_COMMUNITY): Payer: Self-pay

## 2020-04-19 ENCOUNTER — Other Ambulatory Visit: Payer: Self-pay

## 2020-04-19 ENCOUNTER — Inpatient Hospital Stay (HOSPITAL_COMMUNITY): Payer: Medicaid Other | Attending: Internal Medicine

## 2020-04-19 VITALS — BP 148/82 | HR 88 | Temp 97.7°F | Resp 18

## 2020-04-19 DIAGNOSIS — D500 Iron deficiency anemia secondary to blood loss (chronic): Secondary | ICD-10-CM

## 2020-04-19 DIAGNOSIS — E538 Deficiency of other specified B group vitamins: Secondary | ICD-10-CM | POA: Diagnosis not present

## 2020-04-19 MED ORDER — CYANOCOBALAMIN 1000 MCG/ML IJ SOLN
1000.0000 ug | Freq: Once | INTRAMUSCULAR | Status: AC
  Administered 2020-04-19: 11:00:00 1000 ug via INTRAMUSCULAR

## 2020-04-19 NOTE — Progress Notes (Signed)
Amy Andersen tolerated Vit B12 injection well without complaints or incident. VSS Pt discharged self ambulatory in satisfactory condition 

## 2020-04-19 NOTE — Patient Instructions (Signed)
[facility] at [facility] Discharge Instructions  Received Vit B12 injection today. Follow-up as scheduled. Call clinic for any questions or concerns   Thank you for choosing [facility] at [facility] to provide your oncology and hematology care.  To afford each patient quality time with our provider, please arrive at least 15 minutes before your scheduled appointment time.   If you have a lab appointment with the Cancer Center please come in thru the Main Entrance and check in at the main information desk.  You need to re-schedule your appointment should you arrive 10 or more minutes late.  We strive to give you quality time with our providers, and arriving late affects you and other patients whose appointments are after yours.  Also, if you no show three or more times for appointments you may be dismissed from the clinic at the providers discretion.     Again, thank you for choosing [facility].  Our hope is that these requests will decrease the amount of time that you wait before being seen by our physicians.       _____________________________________________________________  Should you have questions after your visit to [facility], please contact our office at [phone] between the hours of 8:00 a.m. and 4:30 p.m.  Voicemails left after 4:00 p.m. will not be returned until the following business day.  For prescription refill requests, have your pharmacy contact our office and allow 72 hours.    Due to Covid, you will need to wear a mask upon entering the hospital. If you do not have a mask, a mask will be given to you at the Main Entrance upon arrival. For doctor visits, patients may have 1 support person with them. For treatment visits, patients can not have anyone with them due to social distancing guidelines and our immunocompromised population.     

## 2020-05-18 ENCOUNTER — Ambulatory Visit (HOSPITAL_COMMUNITY): Payer: Medicaid Other

## 2020-05-18 ENCOUNTER — Inpatient Hospital Stay (HOSPITAL_COMMUNITY): Payer: Medicaid Other | Attending: Hematology

## 2020-05-18 ENCOUNTER — Other Ambulatory Visit: Payer: Self-pay

## 2020-05-18 ENCOUNTER — Other Ambulatory Visit (HOSPITAL_COMMUNITY): Payer: Medicaid Other

## 2020-05-18 DIAGNOSIS — F17210 Nicotine dependence, cigarettes, uncomplicated: Secondary | ICD-10-CM | POA: Insufficient documentation

## 2020-05-18 DIAGNOSIS — N920 Excessive and frequent menstruation with regular cycle: Secondary | ICD-10-CM | POA: Diagnosis present

## 2020-05-18 DIAGNOSIS — Z79899 Other long term (current) drug therapy: Secondary | ICD-10-CM | POA: Insufficient documentation

## 2020-05-18 DIAGNOSIS — D500 Iron deficiency anemia secondary to blood loss (chronic): Secondary | ICD-10-CM | POA: Diagnosis present

## 2020-05-18 DIAGNOSIS — E538 Deficiency of other specified B group vitamins: Secondary | ICD-10-CM | POA: Diagnosis present

## 2020-05-18 DIAGNOSIS — E559 Vitamin D deficiency, unspecified: Secondary | ICD-10-CM | POA: Insufficient documentation

## 2020-05-18 DIAGNOSIS — Z791 Long term (current) use of non-steroidal anti-inflammatories (NSAID): Secondary | ICD-10-CM | POA: Insufficient documentation

## 2020-05-18 LAB — COMPREHENSIVE METABOLIC PANEL
ALT: 43 U/L (ref 0–44)
AST: 28 U/L (ref 15–41)
Albumin: 3.8 g/dL (ref 3.5–5.0)
Alkaline Phosphatase: 61 U/L (ref 38–126)
Anion gap: 8 (ref 5–15)
BUN: 12 mg/dL (ref 6–20)
CO2: 27 mmol/L (ref 22–32)
Calcium: 9.4 mg/dL (ref 8.9–10.3)
Chloride: 101 mmol/L (ref 98–111)
Creatinine, Ser: 0.72 mg/dL (ref 0.44–1.00)
GFR calc Af Amer: >60 mL/min (ref >60)
GFR calc non Af Amer: >60 mL/min (ref >60)
Glucose, Bld: 116 mg/dL — ABNORMAL HIGH (ref 70–99)
Potassium: 3.8 mmol/L (ref 3.5–5.1)
Sodium: 136 mmol/L (ref 135–145)
Total Bilirubin: 0.5 mg/dL (ref 0.3–1.2)
Total Protein: 7.3 g/dL (ref 6.5–8.1)

## 2020-05-18 LAB — FOLATE: Folate: 7.7 ng/mL (ref >5.9)

## 2020-05-18 LAB — LACTATE DEHYDROGENASE: LDH: 119 U/L (ref 98–192)

## 2020-05-18 LAB — CBC WITH DIFFERENTIAL/PLATELET
Abs Immature Granulocytes: 0.01 10*3/uL (ref 0.00–0.07)
Basophils Absolute: 0 10*3/uL (ref 0.0–0.1)
Basophils Relative: 1 %
Eosinophils Absolute: 0.2 10*3/uL (ref 0.0–0.5)
Eosinophils Relative: 4 %
HCT: 46.5 % — ABNORMAL HIGH (ref 36.0–46.0)
Hemoglobin: 14.8 g/dL (ref 12.0–15.0)
Immature Granulocytes: 0 %
Lymphocytes Relative: 27 %
Lymphs Abs: 1.5 10*3/uL (ref 0.7–4.0)
MCH: 30 pg (ref 26.0–34.0)
MCHC: 31.8 g/dL (ref 30.0–36.0)
MCV: 94.1 fL (ref 80.0–100.0)
Monocytes Absolute: 0.3 10*3/uL (ref 0.1–1.0)
Monocytes Relative: 5 %
Neutro Abs: 3.4 10*3/uL (ref 1.7–7.7)
Neutrophils Relative %: 63 %
Platelets: 242 10*3/uL (ref 150–400)
RBC: 4.94 MIL/uL (ref 3.87–5.11)
RDW: 15.8 % — ABNORMAL HIGH (ref 11.5–15.5)
WBC: 5.4 10*3/uL (ref 4.0–10.5)
nRBC: 0 % (ref 0.0–0.2)

## 2020-05-18 LAB — VITAMIN D 25 HYDROXY (VIT D DEFICIENCY, FRACTURES): Vit D, 25-Hydroxy: 52.95 ng/mL (ref 30–100)

## 2020-05-18 LAB — FERRITIN: Ferritin: 29 ng/mL (ref 11–307)

## 2020-05-18 LAB — VITAMIN B12: Vitamin B-12: 363 pg/mL (ref 180–914)

## 2020-05-18 LAB — IRON AND TIBC
Iron: 130 ug/dL (ref 28–170)
Saturation Ratios: 37 % — ABNORMAL HIGH (ref 10.4–31.8)
TIBC: 353 ug/dL (ref 250–450)
UIBC: 223 ug/dL

## 2020-05-23 ENCOUNTER — Inpatient Hospital Stay (HOSPITAL_BASED_OUTPATIENT_CLINIC_OR_DEPARTMENT_OTHER): Payer: Medicaid Other | Admitting: Nurse Practitioner

## 2020-05-23 ENCOUNTER — Inpatient Hospital Stay (HOSPITAL_COMMUNITY): Payer: Medicaid Other

## 2020-05-23 ENCOUNTER — Other Ambulatory Visit: Payer: Self-pay

## 2020-05-23 DIAGNOSIS — D500 Iron deficiency anemia secondary to blood loss (chronic): Secondary | ICD-10-CM

## 2020-05-23 MED ORDER — CYANOCOBALAMIN 1000 MCG/ML IJ SOLN
1000.0000 ug | Freq: Once | INTRAMUSCULAR | Status: AC
  Administered 2020-05-23: 1000 ug via INTRAMUSCULAR
  Filled 2020-05-23: qty 1

## 2020-05-23 NOTE — Patient Instructions (Signed)
[facility] at [facility] Discharge Instructions  Follow up in 4 months with labs    Thank you for choosing [facility] at [facility] to provide your oncology and hematology care.  To afford each patient quality time with our provider, please arrive at least 15 minutes before your scheduled appointment time.   If you have a lab appointment with the Cancer Center please come in thru the Main Entrance and check in at the main information desk.  You need to re-schedule your appointment should you arrive 10 or more minutes late.  We strive to give you quality time with our providers, and arriving late affects you and other patients whose appointments are after yours.  Also, if you no show three or more times for appointments you may be dismissed from the clinic at the providers discretion.     Again, thank you for choosing [facility].  Our hope is that these requests will decrease the amount of time that you wait before being seen by our physicians.       _____________________________________________________________  Should you have questions after your visit to  Cancer Center, please contact our office at [phone] between the hours of 8:00 a.m. and 4:30 p.m.  Voicemails left after 4:00 p.m. will not be returned until the following business day.  For prescription refill requests, have your pharmacy contact our office and allow 72 hours.    Due to Covid, you will need to wear a mask upon entering the hospital. If you do not have a mask, a mask will be given to you at the Main Entrance upon arrival. For doctor visits, patients may have 1 support person with them. For treatment visits, patients can not have anyone with them due to social distancing guidelines and our immunocompromised population.      

## 2020-05-23 NOTE — Progress Notes (Signed)
Patient tolerated injection with no complaints voiced.  Site clean and dry with no bruising or swelling noted at site.  Band aid applied.  VSS with discharge and left ambulatory with no s/s of distress noted.    

## 2020-05-23 NOTE — Assessment & Plan Note (Signed)
1.  Severe iron deficiency anemia: -She was found to be severely anemic with a hemoglobin of 6.8 in March 2019. -She was started on oral iron therapy at that time.  Her hemoglobin on 03/27/2018 had improved to 8.9. - She is actively menstruating with it lasting 7 days every 28 days.  Her heaviest bleeding is for 2 days. -Last Feraheme infusion was on 08/24/2019 and 08/31/2019 -She denies any bright red bleeding per rectum or melena. -Labs on 05/18/2020 showed hemoglobin 14.8, ferritin 29, percent saturation 37. -She reports she had extremely heavy period last week.  Also she is no longer taking her oral iron supplements due to upset stomach.  She also reports extreme fatigue. -We will order 1 infusions of IV iron based on the patient's extreme fatigue. -She will follow-up in 4 months with repeat labs.  2.  Vitamin B12 deficiency: - Her B12 was borderline low at 233. -She received 1 injection of vitamin B12 and was started on oral B12 1 mg tablet daily. - She reports she did not miss any doses however her vitamin B12 came down to 181.  She is likely not absorbing the oral B12. -We will start her on B12 injections monthly. -Labs on 05/18/2020 showed vitamin B12 level 363 -She will receive her injection today.  She will also continue monthly B12 injections -We will check labs again at her next visit.  3.  Vitamin D deficiency -Labs done on 05/18/2020 showed her vitamin D level 52.95 -She will continue to take vitamin D 2000 units weekly. -We will check her labs at her next visit.

## 2020-05-23 NOTE — Progress Notes (Signed)
[facility] [address] [place], [place] [address]   CLINIC:  Medical Oncology/Hematology  PCP:  Elfredia Nevins, MD [address] [place] [address] [phone]   REASON FOR VISIT: Follow-up for iron deficiency anemia   CURRENT THERAPY: Intermittent iron infusions   INTERVAL HISTORY:  Amy Andersen 44 y.o. female returns for routine follow-up for iron deficiency anemia.  Patient reports she is doing well since her last visit however she is experiencing some extreme fatigue.  She has problems doing her daily activities due to her fatigue.  She denies any bright red bleeding per rectum or melena.  She denies any easy bruising or bleeding. Denies any nausea, vomiting, or diarrhea. Denies any new pains. Had not noticed any recent bleeding such as epistaxis, hematuria or hematochezia. Denies recent chest pain on exertion, shortness of breath on minimal exertion, pre-syncopal episodes, or palpitations. Denies any numbness or tingling in hands or feet. Denies any recent fevers, infections, or recent hospitalizations. Patient reports appetite at 50% and energy level at 25%.  She is eating well maintain her weight at this time.    REVIEW OF SYSTEMS:  Review of Systems  Constitutional: Positive for fatigue.  Psychiatric/Behavioral: Positive for sleep disturbance.  All other systems reviewed and are negative.    PAST MEDICAL/SURGICAL HISTORY:  Past Medical History:  Diagnosis Date   Anemia    Chronic pain    Past Surgical History:  Procedure Laterality Date   DILATION AND CURETTAGE OF UTERUS       SOCIAL HISTORY:  Social History   Socioeconomic History   Marital status: Married    Spouse name: Tasia Catchings   Number of children: 3   Years of education: 9   Highest education level: 9th grade  Occupational History   Occupation: Arts development officer   Tobacco Use   Smoking status: Current Every Day Smoker    Packs/day: 0.50    Types: Cigarettes    Smokeless tobacco: Never Used  Substance and Sexual Activity   Alcohol use: No   Drug use: No   Sexual activity: Yes    Birth control/protection: None  Other Topics Concern   Not on file  Social History Narrative   Not on file   Social Determinants of Health   Financial Resource Strain: Low Risk    Difficulty of Paying Living Expenses: Not very hard  Food Insecurity: No Food Insecurity   Worried About Programme researcher, broadcasting/film/video in the Last Year: Never true   Ran Out of Food in the Last Year: Never true  Transportation Needs: No Transportation Needs   Lack of Transportation (Medical): No   Lack of Transportation (Non-Medical): No  Physical Activity: Insufficiently Active   Days of Exercise per Week: 4 days   Minutes of Exercise per Session: 30 min  Stress: No Stress Concern Present   Feeling of Stress : Not at all  Social Connections: Slightly Isolated   Frequency of Communication with Friends and Family: Three times a week   Frequency of Social Gatherings with Friends and Family: Once a week   Attends Religious Services: 1 to 4 times per year   Active Member of [employer] or Organizations: No   Attends Banker Meetings: Never   Marital Status: Married  Catering manager Violence: Not At Risk   Fear of Current or Ex-Partner: No   Emotionally Abused: No   Physically Abused: No   Sexually Abused: No    FAMILY HISTORY:  Family History  Problem Relation Age of Onset   Hypertension Maternal Grandmother    CAD Maternal Grandmother    Cancer Maternal Grandfather    Hypertension Mother     CURRENT MEDICATIONS:  Outpatient Encounter Medications as of 05/23/2020  Medication Sig   diclofenac (VOLTAREN) 75 MG EC tablet Take 75 mg by mouth in the morning and at bedtime.   ergocalciferol (VITAMIN D2) 1.25 MG (50000 UT) capsule Take 1 capsule (50,000 Units total) by mouth once a week. (Patient not taking: Reported on 04/10/2020)    HYDROcodone-acetaminophen (NORCO/VICODIN) 5-325 MG tablet Take 1 tablet by mouth every 6 (six) hours as needed for moderate pain (Must last 30 days.Do not take and drive a car or use machinery.).   [DISCONTINUED] cyclobenzaprine (FLEXERIL) 10 MG tablet Take 10 mg by mouth 3 (three) times daily.   [DISCONTINUED] naproxen (NAPROSYN) 500 MG tablet TAKE ONE TABLET BY MOUTH TWICE DAILY AFTER MEALS. (Patient not taking: Reported on 04/10/2020)   No facility-administered encounter medications on file as of 05/23/2020.    ALLERGIES:  No Known Allergies   PHYSICAL EXAM:  ECOG Performance status: 1  Vitals:   05/23/20 1042  BP: 132/73  Pulse: 83  Resp: 17  Temp: 97.7 F (36.5 C)  SpO2: 100%   Filed Weights   05/23/20 1042  Weight: 201 lb 9.6 oz (91.4 kg)   Physical Exam Constitutional:      Appearance: Normal appearance. She is normal weight.  Cardiovascular:     Rate and Rhythm: Normal rate and regular rhythm.     Heart sounds: Normal heart sounds.  Pulmonary:     Effort: Pulmonary effort is normal.     Breath sounds: Normal breath sounds.  Abdominal:     General: Bowel sounds are normal.     Palpations: Abdomen is soft.  Musculoskeletal:        General: Normal range of motion.  Skin:    General: Skin is warm.  Neurological:     Mental Status: She is alert and oriented to person, place, and time. Mental status is at baseline.  Psychiatric:        Mood and Affect: Mood normal.        Behavior: Behavior normal.        Thought Content: Thought content normal.        Judgment: Judgment normal.      LABORATORY DATA:  I have reviewed the labs as listed.  CBC    Component Value Date/Time   WBC 5.4 05/18/2020 0811   RBC 4.94 05/18/2020 0811   HGB 14.8 05/18/2020 0811   HCT 46.5 (H) 05/18/2020 0811   PLT 242 05/18/2020 0811   MCV 94.1 05/18/2020 0811   MCH 30.0 05/18/2020 0811   MCHC 31.8 05/18/2020 0811   RDW 15.8 (H) 05/18/2020 0811   LYMPHSABS 1.5 05/18/2020 0811    MONOABS 0.3 05/18/2020 0811   EOSABS 0.2 05/18/2020 0811   BASOSABS 0.0 05/18/2020 0811   CMP Latest Ref Rng & Units 05/18/2020 02/10/2020 11/17/2019  Glucose 70 - 99 mg/dL 831(D) 176(H) 607(P)  BUN 6 - 20 mg/dL 12 9 12   Creatinine 0.44 - 1.00 mg/dL 7.10 6.26  Sodium 135 - 145 mmol/L 136 137 139  Potassium 3.5 - 5.1 mmol/L 3.8 3.3(L) 3.5  Chloride 98 - 111 mmol/L 101 101 103  CO2 22 - 32 mmol/L 27 27 24   Calcium 8.9 - 10.3 mg/dL 9.4 9.1 9.7  Total Protein 6.5 - 8.1 g/dL 7.3 7.7 8.0  Total Bilirubin 0.3 - 1.2 mg/dL 0.5 0.5 0.4  Alkaline Phos 38 - 126 U/L 61 69 79  AST 15 - 41 U/L 28 18 13(L)  ALT 0 - 44 U/L 43 18 12    All questions were answered to patient's stated satisfaction. Encouraged patient to call with any new concerns or questions before his next visit to the cancer center and we can certain see him sooner, if needed.     ASSESSMENT & PLAN:  Iron deficiency anemia due to chronic blood loss 1.  Severe iron deficiency anemia: -She was found to be severely anemic with a hemoglobin of 6.8 in March 2019. -She was started on oral iron therapy at that time.  Her hemoglobin on 03/27/2018 had improved to 8.9. - She is actively menstruating with it lasting 7 days every 28 days.  Her heaviest bleeding is for 2 days. -Last Feraheme infusion was on 08/24/2019 and 08/31/2019 -She denies any bright red bleeding per rectum or melena. -Labs on 05/18/2020 showed hemoglobin 14.8, ferritin 29, percent saturation 37. -She reports she had extremely heavy period last week.  Also she is no longer taking her oral iron supplements due to upset stomach.  She also reports extreme fatigue. -We will order 1 infusions of IV iron based on the patient's extreme fatigue. -She will follow-up in 4 months with repeat labs.  2.  Vitamin B12 deficiency: - Her B12 was borderline low at 233. -She received 1 injection of vitamin B12 and was started on oral B12 1 mg tablet daily. - She reports she did not miss  any doses however her vitamin B12 came down to 181.  She is likely not absorbing the oral B12. -We will start her on B12 injections monthly. -Labs on 05/18/2020 showed vitamin B12 level 363 -She will receive her injection today.  She will also continue monthly B12 injections -We will check labs again at her next visit.  3.  Vitamin D deficiency -Labs done on 05/18/2020 showed her vitamin D level 52.95 -She will continue to take vitamin D 2000 units weekly. -We will check her labs at her next visit.     Orders placed this encounter:  Orders Placed This Encounter  Procedures   Lactate dehydrogenase   CBC with Differential/Platelet   Comprehensive metabolic panel   Ferritin   Iron and TIBC   Vitamin B12   VITAMIN D 25 Hydroxy (Vit-D Deficiency, Fractures)      Francene Finders, FNP-C [place] [phone]

## 2020-05-31 ENCOUNTER — Inpatient Hospital Stay (HOSPITAL_COMMUNITY): Payer: Medicaid Other

## 2020-05-31 ENCOUNTER — Encounter (HOSPITAL_COMMUNITY): Payer: Self-pay

## 2020-05-31 ENCOUNTER — Other Ambulatory Visit: Payer: Self-pay

## 2020-05-31 VITALS — BP 145/74 | HR 92 | Temp 98.7°F | Resp 18

## 2020-05-31 DIAGNOSIS — D500 Iron deficiency anemia secondary to blood loss (chronic): Secondary | ICD-10-CM | POA: Diagnosis not present

## 2020-05-31 MED ORDER — SODIUM CHLORIDE 0.9 % IV SOLN
510.0000 mg | Freq: Once | INTRAVENOUS | Status: AC
  Administered 2020-05-31: 510 mg via INTRAVENOUS
  Filled 2020-05-31: qty 510

## 2020-05-31 MED ORDER — SODIUM CHLORIDE 0.9 % IV SOLN: Freq: Once | INTRAVENOUS | Status: AC

## 2020-05-31 NOTE — Progress Notes (Signed)
Feraheme given today per MD orders. Tolerated infusion without adverse affects. Vital signs stable. No complaints at this time. Discharged from clinic ambulatory. F/U with [facility] as scheduled.  

## 2020-05-31 NOTE — Patient Instructions (Signed)
[facility] at [facility]  Discharge Instructions:   _______________________________________________________________  Thank you for choosing [facility] at [facility] to provide your oncology and hematology care.  To afford each patient quality time with our providers, please arrive at least 15 minutes before your scheduled appointment.  You need to re-schedule your appointment if you arrive 10 or more minutes late.  We strive to give you quality time with our providers, and arriving late affects you and other patients whose appointments are after yours.  Also, if you no show three or more times for appointments you may be dismissed from the clinic.  Again, thank you for choosing [facility] at [facility]. Our hope is that these requests will allow you access to exceptional care and in a timely manner. _______________________________________________________________  If you have questions after your visit, please contact our office at [phone] between the hours of 8:30 a.m. and 5:00 p.m. Voicemails left after 4:30 p.m. will not be returned until the following business day. _______________________________________________________________  For prescription refill requests, have your pharmacy contact our office. _______________________________________________________________  Recommendations made by the consultant and any test results will be sent to your referring physician. _______________________________________________________________ 

## 2020-06-22 ENCOUNTER — Encounter (HOSPITAL_COMMUNITY): Payer: Self-pay

## 2020-06-22 ENCOUNTER — Inpatient Hospital Stay (HOSPITAL_COMMUNITY): Payer: Medicaid Other | Attending: Internal Medicine

## 2020-06-22 VITALS — BP 139/82 | HR 91 | Temp 98.2°F | Resp 18

## 2020-06-22 DIAGNOSIS — E538 Deficiency of other specified B group vitamins: Secondary | ICD-10-CM | POA: Insufficient documentation

## 2020-06-22 DIAGNOSIS — D500 Iron deficiency anemia secondary to blood loss (chronic): Secondary | ICD-10-CM

## 2020-06-22 MED ORDER — CYANOCOBALAMIN 1000 MCG/ML IJ SOLN
1000.0000 ug | Freq: Once | INTRAMUSCULAR | Status: AC
  Administered 2020-06-22: 1000 ug via INTRAMUSCULAR
  Filled 2020-06-22: qty 1

## 2020-06-22 NOTE — Progress Notes (Signed)
Amy Andersen presents today for injection per the provider's orders.  B12 administration without incident; injection site WNL; see MAR for injection details.  Patient tolerated procedure well and without incident.  No questions or complaints noted at this time. Pt d/c clinic ambulatory.

## 2020-07-24 ENCOUNTER — Inpatient Hospital Stay (HOSPITAL_COMMUNITY): Payer: Medicaid Other | Attending: Internal Medicine

## 2020-07-24 ENCOUNTER — Encounter (HOSPITAL_COMMUNITY): Payer: Self-pay

## 2020-07-24 ENCOUNTER — Other Ambulatory Visit: Payer: Self-pay

## 2020-07-24 VITALS — BP 125/78 | HR 95 | Temp 97.9°F | Resp 18

## 2020-07-24 DIAGNOSIS — E538 Deficiency of other specified B group vitamins: Secondary | ICD-10-CM | POA: Insufficient documentation

## 2020-07-24 DIAGNOSIS — D500 Iron deficiency anemia secondary to blood loss (chronic): Secondary | ICD-10-CM

## 2020-07-24 MED ORDER — CYANOCOBALAMIN 1000 MCG/ML IJ SOLN
1000.0000 ug | Freq: Once | INTRAMUSCULAR | Status: AC
  Administered 2020-07-24: 1000 ug via INTRAMUSCULAR

## 2020-07-24 NOTE — Patient Instructions (Signed)
[facility] at [facility] Discharge Instructions  Received Vit B12 injection today. Follow-up as scheduled   Thank you for choosing [facility] at [facility] to provide your oncology and hematology care.  To afford each patient quality time with our provider, please arrive at least 15 minutes before your scheduled appointment time.   If you have a lab appointment with the Cancer Center please come in thru the Main Entrance and check in at the main information desk.  You need to re-schedule your appointment should you arrive 10 or more minutes late.  We strive to give you quality time with our providers, and arriving late affects you and other patients whose appointments are after yours.  Also, if you no show three or more times for appointments you may be dismissed from the clinic at the providers discretion.     Again, thank you for choosing [facility].  Our hope is that these requests will decrease the amount of time that you wait before being seen by our physicians.       _____________________________________________________________  Should you have questions after your visit to [facility], please contact our office at [phone] and follow the prompts.  Our office hours are 8:00 a.m. and 4:30 p.m. Monday - Friday.  Please note that voicemails left after 4:00 p.m. may not be returned until the following business day.  We are closed weekends and major holidays.  You do have access to a nurse 24-7, just call the main number to the clinic [phone] and do not press any options, hold on the line and a nurse will answer the phone.    For prescription refill requests, have your pharmacy contact our office and allow 72 hours.    Due to Covid, you will need to wear a mask upon entering the hospital. If you do not have a mask, a mask will be given to you at the Main Entrance upon arrival. For doctor visits, patients may have  1 support person age 18 or older with them. For treatment visits, patients can not have anyone with them due to social distancing guidelines and our immunocompromised population.     

## 2020-07-24 NOTE — Progress Notes (Signed)
Amy Andersen tolerated Vit B 12 Injection well without complaints or incident. VSS Pt discharged self ambulatory in satisfactory condition

## 2020-08-24 ENCOUNTER — Other Ambulatory Visit: Payer: Self-pay

## 2020-08-24 ENCOUNTER — Encounter (HOSPITAL_COMMUNITY): Payer: Self-pay

## 2020-08-24 ENCOUNTER — Inpatient Hospital Stay (HOSPITAL_COMMUNITY): Payer: Medicaid Other | Attending: Internal Medicine

## 2020-08-24 VITALS — BP 138/79 | HR 95 | Temp 98.1°F | Resp 18

## 2020-08-24 DIAGNOSIS — E538 Deficiency of other specified B group vitamins: Secondary | ICD-10-CM | POA: Diagnosis not present

## 2020-08-24 DIAGNOSIS — D500 Iron deficiency anemia secondary to blood loss (chronic): Secondary | ICD-10-CM

## 2020-08-24 MED ORDER — CYANOCOBALAMIN 1000 MCG/ML IJ SOLN
1000.0000 ug | Freq: Once | INTRAMUSCULAR | Status: AC
  Administered 2020-08-24: 1000 ug via INTRAMUSCULAR

## 2020-08-24 NOTE — Patient Instructions (Signed)
[facility] at [facility] Discharge Instructions  Received Vit B12 injection today. Follow-up as scheduled   Thank you for choosing [facility] at [facility] to provide your oncology and hematology care.  To afford each patient quality time with our provider, please arrive at least 15 minutes before your scheduled appointment time.   If you have a lab appointment with the Cancer Center please come in thru the Main Entrance and check in at the main information desk.  You need to re-schedule your appointment should you arrive 10 or more minutes late.  We strive to give you quality time with our providers, and arriving late affects you and other patients whose appointments are after yours.  Also, if you no show three or more times for appointments you may be dismissed from the clinic at the providers discretion.     Again, thank you for choosing [facility].  Our hope is that these requests will decrease the amount of time that you wait before being seen by our physicians.       _____________________________________________________________  Should you have questions after your visit to [facility], please contact our office at [phone] and follow the prompts.  Our office hours are 8:00 a.m. and 4:30 p.m. Monday - Friday.  Please note that voicemails left after 4:00 p.m. may not be returned until the following business day.  We are closed weekends and major holidays.  You do have access to a nurse 24-7, just call the main number to the clinic [phone] and do not press any options, hold on the line and a nurse will answer the phone.    For prescription refill requests, have your pharmacy contact our office and allow 72 hours.    Due to Covid, you will need to wear a mask upon entering the hospital. If you do not have a mask, a mask will be given to you at the Main Entrance upon arrival. For doctor visits, patients may have  1 support person age 18 or older with them. For treatment visits, patients can not have anyone with them due to social distancing guidelines and our immunocompromised population.     

## 2020-08-24 NOTE — Progress Notes (Signed)
Amy Andersen tolerated Vit B12 injection well without complaints or incident. VSS Pt discharged self ambulatory in satisfactory condition 

## 2020-09-19 ENCOUNTER — Other Ambulatory Visit (HOSPITAL_COMMUNITY): Payer: Self-pay

## 2020-09-19 DIAGNOSIS — D500 Iron deficiency anemia secondary to blood loss (chronic): Secondary | ICD-10-CM

## 2020-09-19 DIAGNOSIS — E538 Deficiency of other specified B group vitamins: Secondary | ICD-10-CM

## 2020-09-19 DIAGNOSIS — E559 Vitamin D deficiency, unspecified: Secondary | ICD-10-CM

## 2020-09-20 ENCOUNTER — Other Ambulatory Visit: Payer: Self-pay

## 2020-09-20 ENCOUNTER — Inpatient Hospital Stay (HOSPITAL_COMMUNITY): Payer: Medicaid Other | Attending: Hematology

## 2020-09-20 DIAGNOSIS — D500 Iron deficiency anemia secondary to blood loss (chronic): Secondary | ICD-10-CM

## 2020-09-20 DIAGNOSIS — Z8249 Family history of ischemic heart disease and other diseases of the circulatory system: Secondary | ICD-10-CM | POA: Insufficient documentation

## 2020-09-20 DIAGNOSIS — F17210 Nicotine dependence, cigarettes, uncomplicated: Secondary | ICD-10-CM | POA: Diagnosis not present

## 2020-09-20 DIAGNOSIS — Z79899 Other long term (current) drug therapy: Secondary | ICD-10-CM | POA: Insufficient documentation

## 2020-09-20 DIAGNOSIS — E538 Deficiency of other specified B group vitamins: Secondary | ICD-10-CM | POA: Diagnosis present

## 2020-09-20 DIAGNOSIS — Z809 Family history of malignant neoplasm, unspecified: Secondary | ICD-10-CM | POA: Diagnosis not present

## 2020-09-20 DIAGNOSIS — E669 Obesity, unspecified: Secondary | ICD-10-CM | POA: Insufficient documentation

## 2020-09-20 DIAGNOSIS — R42 Dizziness and giddiness: Secondary | ICD-10-CM | POA: Diagnosis not present

## 2020-09-20 DIAGNOSIS — M2550 Pain in unspecified joint: Secondary | ICD-10-CM | POA: Diagnosis not present

## 2020-09-20 DIAGNOSIS — E559 Vitamin D deficiency, unspecified: Secondary | ICD-10-CM

## 2020-09-20 DIAGNOSIS — D509 Iron deficiency anemia, unspecified: Secondary | ICD-10-CM | POA: Insufficient documentation

## 2020-09-20 LAB — CBC WITH DIFFERENTIAL/PLATELET
Abs Immature Granulocytes: 0.03 10*3/uL (ref 0.00–0.07)
Basophils Absolute: 0.1 10*3/uL (ref 0.0–0.1)
Basophils Relative: 1 %
Eosinophils Absolute: 0.3 10*3/uL (ref 0.0–0.5)
Eosinophils Relative: 4 %
HCT: 46.9 % — ABNORMAL HIGH (ref 36.0–46.0)
Hemoglobin: 15.5 g/dL — ABNORMAL HIGH (ref 12.0–15.0)
Immature Granulocytes: 0 %
Lymphocytes Relative: 30 %
Lymphs Abs: 2.4 10*3/uL (ref 0.7–4.0)
MCH: 31.7 pg (ref 26.0–34.0)
MCHC: 33 g/dL (ref 30.0–36.0)
MCV: 95.9 fL (ref 80.0–100.0)
Monocytes Absolute: 0.5 10*3/uL (ref 0.1–1.0)
Monocytes Relative: 7 %
Neutro Abs: 4.7 10*3/uL (ref 1.7–7.7)
Neutrophils Relative %: 58 %
Platelets: 263 10*3/uL (ref 150–400)
RBC: 4.89 MIL/uL (ref 3.87–5.11)
RDW: 11.8 % (ref 11.5–15.5)
WBC: 8.1 10*3/uL (ref 4.0–10.5)
nRBC: 0 % (ref 0.0–0.2)

## 2020-09-20 LAB — VITAMIN D 25 HYDROXY (VIT D DEFICIENCY, FRACTURES): Vit D, 25-Hydroxy: 34.14 ng/mL (ref 30–100)

## 2020-09-20 LAB — COMPREHENSIVE METABOLIC PANEL
ALT: 14 U/L (ref 0–44)
AST: 14 U/L — ABNORMAL LOW (ref 15–41)
Albumin: 3.8 g/dL (ref 3.5–5.0)
Alkaline Phosphatase: 55 U/L (ref 38–126)
Anion gap: 6 (ref 5–15)
BUN: 9 mg/dL (ref 6–20)
CO2: 29 mmol/L (ref 22–32)
Calcium: 9.5 mg/dL (ref 8.9–10.3)
Chloride: 103 mmol/L (ref 98–111)
Creatinine, Ser: 0.75 mg/dL (ref 0.44–1.00)
GFR calc non Af Amer: >60 mL/min (ref >60)
Glucose, Bld: 102 mg/dL — ABNORMAL HIGH (ref 70–99)
Potassium: 4.2 mmol/L (ref 3.5–5.1)
Sodium: 138 mmol/L (ref 135–145)
Total Bilirubin: 0.6 mg/dL (ref 0.3–1.2)
Total Protein: 7.7 g/dL (ref 6.5–8.1)

## 2020-09-20 LAB — IRON AND TIBC
Iron: 58 ug/dL (ref 28–170)
Saturation Ratios: 14 % (ref 10.4–31.8)
TIBC: 403 ug/dL (ref 250–450)
UIBC: 345 ug/dL

## 2020-09-20 LAB — LACTATE DEHYDROGENASE: LDH: 121 U/L (ref 98–192)

## 2020-09-20 LAB — FERRITIN: Ferritin: 12 ng/mL (ref 11–307)

## 2020-09-20 LAB — VITAMIN B12: Vitamin B-12: 452 pg/mL (ref 180–914)

## 2020-09-26 ENCOUNTER — Inpatient Hospital Stay (HOSPITAL_BASED_OUTPATIENT_CLINIC_OR_DEPARTMENT_OTHER): Payer: Medicaid Other | Admitting: Hematology

## 2020-09-26 ENCOUNTER — Ambulatory Visit (HOSPITAL_COMMUNITY): Payer: Medicaid Other

## 2020-09-26 ENCOUNTER — Encounter (HOSPITAL_COMMUNITY): Payer: Self-pay | Admitting: Hematology

## 2020-09-26 ENCOUNTER — Other Ambulatory Visit: Payer: Self-pay

## 2020-09-26 ENCOUNTER — Inpatient Hospital Stay (HOSPITAL_COMMUNITY): Payer: Medicaid Other

## 2020-09-26 ENCOUNTER — Other Ambulatory Visit (HOSPITAL_COMMUNITY): Payer: Self-pay

## 2020-09-26 ENCOUNTER — Ambulatory Visit (HOSPITAL_COMMUNITY): Payer: Medicaid Other | Admitting: Nurse Practitioner

## 2020-09-26 VITALS — BP 137/85 | HR 98 | Temp 97.2°F | Resp 18 | Wt 197.8 lb

## 2020-09-26 DIAGNOSIS — D500 Iron deficiency anemia secondary to blood loss (chronic): Secondary | ICD-10-CM | POA: Diagnosis not present

## 2020-09-26 DIAGNOSIS — D509 Iron deficiency anemia, unspecified: Secondary | ICD-10-CM | POA: Diagnosis not present

## 2020-09-26 DIAGNOSIS — E538 Deficiency of other specified B group vitamins: Secondary | ICD-10-CM

## 2020-09-26 DIAGNOSIS — E559 Vitamin D deficiency, unspecified: Secondary | ICD-10-CM

## 2020-09-26 MED ORDER — CYANOCOBALAMIN 1000 MCG/ML IJ SOLN
INTRAMUSCULAR | Status: AC
  Filled 2020-09-26: qty 1

## 2020-09-26 MED ORDER — CYANOCOBALAMIN 1000 MCG/ML IJ SOLN
1000.0000 ug | Freq: Once | INTRAMUSCULAR | Status: AC
  Administered 2020-09-26: 1000 ug via INTRAMUSCULAR

## 2020-09-26 NOTE — Progress Notes (Signed)
[place]en, [place] [address]   CLINIC:  Medical Oncology/Hematology  PCP:  Elfredia Nevins, Amy [address] / [place] [address]  [phone]  REASON FOR VISIT:  Follow-up for IDA and B12 deficiency  PRIOR THERAPY: Iron tablets  CURRENT THERAPY: Intermittent Feraheme and B12 infections  INTERVAL HISTORY:  Ms. JAKEISHA STRICKER, a 44 y.o. female, returns for routine follow-up for her IDA and B12 deficiency. Anitria was last seen on 11/30/2018.  Today she reports feeling fatigued. She is able to do her ADL but gets tired quickly; she denies having melena, hematochezia or hematuria. She will get lightheaded when she stands up quickly, but denies CP with exertion. She received her last Feraheme on 05/31/2020 and her energy levels were good for several months. She used to take iron tablets, but they made her constipated. She denies having abdominal pain.   REVIEW OF SYSTEMS:  Review of Systems  Constitutional: Positive for appetite change (75%) and fatigue (25%).  Cardiovascular: Negative for chest pain.  Gastrointestinal: Negative for abdominal pain and blood in stool.  Genitourinary: Negative for hematuria.   Musculoskeletal: Positive for arthralgias (6/10 R knee).  Neurological: Positive for light-headedness (upon standing).  All other systems reviewed and are negative.   PAST MEDICAL/SURGICAL HISTORY:  Past Medical History:  Diagnosis Date  . Anemia   . Chronic pain    Past Surgical History:  Procedure Laterality Date  . DILATION AND CURETTAGE OF UTERUS      SOCIAL HISTORY:  Social History   Socioeconomic History  . Marital status: Married    Spouse name: Tasia Catchings  . Number of children: 3  . Years of education: 9  . Highest education level: 9th grade  Occupational History  . Occupation: home maker   Tobacco Use  . Smoking status: Current Every Day Smoker    Packs/day: 0.50    Types: Cigarettes  . Smokeless  tobacco: Never Used  Vaping Use  . Vaping Use: Never used  Substance and Sexual Activity  . Alcohol use: No  . Drug use: No  . Sexual activity: Yes    Birth control/protection: None  Other Topics Concern  . Not on file  Social History Narrative  . Not on file   Social Determinants of Health   Financial Resource Strain: Low Risk   . Difficulty of Paying Living Expenses: Not very hard  Food Insecurity: No Food Insecurity  . Worried About Programme researcher, broadcasting/film/video in the Last Year: Never true  . Ran Out of Food in the Last Year: Never true  Transportation Needs: No Transportation Needs  . Lack of Transportation (Medical): No  . Lack of Transportation (Non-Medical): No  Physical Activity: Insufficiently Active  . Days of Exercise per Week: 4 days  . Minutes of Exercise per Session: 30 min  Stress: No Stress Concern Present  . Feeling of Stress : Not at all  Social Connections: Moderately Integrated  . Frequency of Communication with Friends and Family: Three times a week  . Frequency of Social Gatherings with Friends and Family: Once a week  . Attends Religious Services: 1 to 4 times per year  . Active Member of Clubs or Organizations: No  . Attends Banker Meetings: Never  . Marital Status: Married  Catering manager Violence: Not At Risk  . Fear of Current or Ex-Partner: No  . Emotionally Abused: No  . Physically Abused: No  . Sexually Abused: No  FAMILY HISTORY:  Family History  Problem Relation Age of Onset  . Hypertension Maternal Grandmother   . CAD Maternal Grandmother   . Cancer Maternal Grandfather   . Hypertension Mother     CURRENT MEDICATIONS:  Current Outpatient Medications  Medication Sig Dispense Refill  . ergocalciferol (VITAMIN D2) 1.25 MG (50000 UT) capsule Take 1 capsule (50,000 Units total) by mouth once a week. 16 capsule 4  . HYDROcodone-acetaminophen (NORCO/VICODIN) 5-325 MG tablet Take 1 tablet by mouth every 6 (six) hours as needed  for moderate pain (Must last 30 days.Do not take and drive a car or use machinery.). 20 tablet 0  . meloxicam (MOBIC) 15 MG tablet Take 15 mg by mouth daily.    . RESTASIS 0.05 % ophthalmic emulsion Place 1 drop into both eyes 2 (two) times daily.      No current facility-administered medications for this visit.    ALLERGIES:  No Known Allergies  PHYSICAL EXAM:  Performance status (ECOG): 1 - Symptomatic but completely ambulatory  Vitals:   09/26/20 1523  BP: 137/85  Pulse: 98  Resp: 18  Temp: (!) 97.2 F (36.2 C)  SpO2: 98%   Wt Readings from Last 3 Encounters:  09/26/20 197 lb 12.8 oz (89.7 kg)  05/23/20 201 lb 9.6 oz (91.4 kg)  04/10/20 202 lb (91.6 kg)   Physical Exam Vitals reviewed.  Constitutional:      Appearance: Normal appearance. She is obese.  Neurological:     General: No focal deficit present.     Mental Status: She is alert and oriented to person, place, and time.  Psychiatric:        Mood and Affect: Mood normal.        Behavior: Behavior normal.     LABORATORY DATA:  I have reviewed the labs as listed.  CBC Latest Ref Rng & Units 09/20/2020 05/18/2020 02/10/2020  WBC 4.0 - 10.5 K/uL 8.1 5.4 9.2  Hemoglobin 12.0 - 15.0 g/dL 15.5(H) 14.8 11.6(L)  Hematocrit 36 - 46 % 46.9(H) 46.5(H) 37.0  Platelets 150 - 400 K/uL 263 242 438(H)   CMP Latest Ref Rng & Units 09/20/2020 05/18/2020 02/10/2020  Glucose 70 - 99 mg/dL 329(V) 916(O) 060(O)  BUN 6 - 20 mg/dL 9 12 9   Creatinine 0.44 - 1.00 mg/dL 4.59 9.77  Sodium 135 - 145 mmol/L 138 136 137  Potassium 3.5 - 5.1 mmol/L 4.2 3.8 3.3(L)  Chloride 98 - 111 mmol/L 103 101 101  CO2 22 - 32 mmol/L 29 27 27   Calcium 8.9 - 10.3 mg/dL 9.5 9.4 9.1  Total Protein 6.5 - 8.1 g/dL 7.7 7.3 7.7  Total Bilirubin 0.3 - 1.2 mg/dL 0.6 0.5 0.5  Alkaline Phos 38 - 126 U/L 55 61 69  AST 15 - 41 U/L 14(L) 28 18  ALT 0 - 44 U/L 14 43 18      Component Value Date/Time   RBC 4.89 09/20/2020 1054   MCV 95.9 09/20/2020 1054    MCH 31.7 09/20/2020 1054   MCHC 33.0 09/20/2020 1054   RDW 11.8 09/20/2020 1054   LYMPHSABS 2.4 09/20/2020 1054   MONOABS 0.5 09/20/2020 1054   EOSABS 0.3 09/20/2020 1054   BASOSABS 0.1 09/20/2020 1054   Lab Results  Component Value Date   VD25OH 34.14 09/20/2020   VD25OH 52.95 05/18/2020   VD25OH 62.33 02/10/2020   Lab Results  Component Value Date   TIBC 403 09/20/2020   TIBC 353 05/18/2020   TIBC 460 (H)  02/10/2020   FERRITIN 12 09/20/2020   FERRITIN 29 05/18/2020   FERRITIN 7 (L) 02/10/2020   IRONPCTSAT 14 09/20/2020   IRONPCTSAT 37 (H) 05/18/2020   IRONPCTSAT 5 (L) 02/10/2020    DIAGNOSTIC IMAGING:  I have independently reviewed the scans and discussed with the patient. No results found.   ASSESSMENT:  1.  Severe iron deficiency anemia: - She was found to be severely anemic with a hemoglobin of 6.8 in March 2019.  She was started on oral iron therapy.  Her hemoglobin on 03/27/2018 improved to 8.9. - Her most recent blood work on 06/04/2018 showed ferritin of 5, TIBC of 505, vitamin B12 of 233.  Folic acid was normal. - She is actively menstruating, with 7/28 days.  She has heavy bleeding 2 days.  Denies any rectal bleeding or hematuria. -Last Feraheme on 05/31/2020.  2.  Vitamin B12 deficiency: -She had a history of B12 deficiency and has been on B12 injections.   PLAN:  1.  Severe iron deficiency anemia: -We reviewed labs from 09/20/2020.  Ferritin decreased to 12. -She could not tolerate iron tablets secondary to nausea. -She reports fatigue.  I have recommended weekly Feraheme x2. -RTC 6 months with repeat labs.  2.  Vitamin B12 deficiency: -Continue monthly B12 injections.   Orders placed this encounter:  No orders of the defined types were placed in this encounter.    Doreatha Massed, [place] [facility] [phone]   I, Drue Second, am acting as a scribe for Dr. Payton Mccallum.  I, Doreatha Massed [place], have reviewed the  above documentation for accuracy and completeness, and I agree with the above.

## 2020-09-26 NOTE — Patient Instructions (Signed)
[facility] at [facility] Discharge Instructions  Received Vit B12 injection today. Follow-up as scheduled   Thank you for choosing [facility] at [facility] to provide your oncology and hematology care.  To afford each patient quality time with our provider, please arrive at least 15 minutes before your scheduled appointment time.   If you have a lab appointment with the Cancer Center please come in thru the Main Entrance and check in at the main information desk.  You need to re-schedule your appointment should you arrive 10 or more minutes late.  We strive to give you quality time with our providers, and arriving late affects you and other patients whose appointments are after yours.  Also, if you no show three or more times for appointments you may be dismissed from the clinic at the providers discretion.     Again, thank you for choosing [facility].  Our hope is that these requests will decrease the amount of time that you wait before being seen by our physicians.       _____________________________________________________________  Should you have questions after your visit to [facility], please contact our office at [phone] and follow the prompts.  Our office hours are 8:00 a.m. and 4:30 p.m. Monday - Friday.  Please note that voicemails left after 4:00 p.m. may not be returned until the following business day.  We are closed weekends and major holidays.  You do have access to a nurse 24-7, just call the main number to the clinic [phone] and do not press any options, hold on the line and a nurse will answer the phone.    For prescription refill requests, have your pharmacy contact our office and allow 72 hours.    Due to Covid, you will need to wear a mask upon entering the hospital. If you do not have a mask, a mask will be given to you at the Main Entrance upon arrival. For doctor visits, patients may have  1 support person age 18 or older with them. For treatment visits, patients can not have anyone with them due to social distancing guidelines and our immunocompromised population.     

## 2020-09-26 NOTE — Patient Instructions (Signed)
[facility] at [facility] Discharge Instructions  You were seen today by Dr. Ellin Saba. He went over your recent results. You received your vitamin B12 injection today; continue receiving it every month. You will be scheduled for 2 iron infusions 1 week apart. Dr. Ellin Saba will see you back in 6 months for labs and follow up.   Thank you for choosing [facility] at [facility] to provide your oncology and hematology care.  To afford each patient quality time with our provider, please arrive at least 15 minutes before your scheduled appointment time.   If you have a lab appointment with the Cancer Center please come in thru the Main Entrance and check in at the main information desk  You need to re-schedule your appointment should you arrive 10 or more minutes late.  We strive to give you quality time with our providers, and arriving late affects you and other patients whose appointments are after yours.  Also, if you no show three or more times for appointments you may be dismissed from the clinic at the providers discretion.     Again, thank you for choosing [facility]  Our hope is that these requests will decrease the amount of time that you wait before being seen by our physicians.       _____________________________________________________________  Should you have questions after your visit to [facility], please contact our office at [phone] between the hours of 8:00 a.m. and 4:30 p.m.  Voicemails left after 4:00 p.m. will not be returned until the following business day.  For prescription refill requests, have your pharmacy contact our office and allow 72 hours.    Cancer Center Support Programs:   > Cancer Support Group  2nd Tuesday of the month 1pm-2pm, Journey Room

## 2020-09-26 NOTE — Progress Notes (Signed)
Amy Andersen tolerated Vit B12 injection well without complaints or incident. VSS Pt to see MD today prior to discharge for scheduled appt.

## 2020-09-28 ENCOUNTER — Inpatient Hospital Stay (HOSPITAL_COMMUNITY): Payer: Medicaid Other

## 2020-09-28 VITALS — BP 147/76 | HR 75 | Temp 97.1°F | Resp 18

## 2020-09-28 DIAGNOSIS — D509 Iron deficiency anemia, unspecified: Secondary | ICD-10-CM | POA: Diagnosis not present

## 2020-09-28 DIAGNOSIS — D500 Iron deficiency anemia secondary to blood loss (chronic): Secondary | ICD-10-CM

## 2020-09-28 MED ORDER — SODIUM CHLORIDE 0.9 % IV SOLN: INTRAVENOUS | Status: DC

## 2020-09-28 MED ORDER — SODIUM CHLORIDE 0.9 % IV SOLN
510.0000 mg | Freq: Once | INTRAVENOUS | Status: AC
  Administered 2020-09-28: 510 mg via INTRAVENOUS
  Filled 2020-09-28: qty 510

## 2020-09-29 NOTE — Progress Notes (Signed)
Late entry- Iron infusiongiven per orders. Patient tolerated it well without problems. Vitals stable and discharged home from clinic ambulatory. Follow up as scheduled.

## 2020-10-06 ENCOUNTER — Other Ambulatory Visit: Payer: Self-pay

## 2020-10-06 ENCOUNTER — Inpatient Hospital Stay (HOSPITAL_COMMUNITY): Payer: Medicaid Other

## 2020-10-06 VITALS — BP 143/87 | HR 79 | Temp 97.4°F | Resp 18

## 2020-10-06 DIAGNOSIS — D500 Iron deficiency anemia secondary to blood loss (chronic): Secondary | ICD-10-CM

## 2020-10-06 DIAGNOSIS — D509 Iron deficiency anemia, unspecified: Secondary | ICD-10-CM | POA: Diagnosis not present

## 2020-10-06 MED ORDER — SODIUM CHLORIDE 0.9 % IV SOLN
510.0000 mg | Freq: Once | INTRAVENOUS | Status: AC
  Administered 2020-10-06: 510 mg via INTRAVENOUS
  Filled 2020-10-06: qty 510

## 2020-10-06 MED ORDER — SODIUM CHLORIDE 0.9 % IV SOLN: Freq: Once | INTRAVENOUS | Status: AC

## 2020-10-06 NOTE — Progress Notes (Signed)
Tolerated infusion w/o adverse reaction.  Alert, in no distress.  VSS.  Discharged ambulatory in stable condition.  

## 2020-10-27 ENCOUNTER — Inpatient Hospital Stay (HOSPITAL_COMMUNITY): Payer: Medicaid Other | Attending: Internal Medicine

## 2020-10-27 ENCOUNTER — Encounter (HOSPITAL_COMMUNITY): Payer: Self-pay

## 2020-10-27 ENCOUNTER — Other Ambulatory Visit: Payer: Self-pay

## 2020-10-27 VITALS — BP 142/73 | HR 98 | Temp 97.2°F | Resp 18

## 2020-10-27 DIAGNOSIS — E538 Deficiency of other specified B group vitamins: Secondary | ICD-10-CM | POA: Diagnosis not present

## 2020-10-27 DIAGNOSIS — D500 Iron deficiency anemia secondary to blood loss (chronic): Secondary | ICD-10-CM

## 2020-10-27 MED ORDER — CYANOCOBALAMIN 1000 MCG/ML IJ SOLN
1000.0000 ug | Freq: Once | INTRAMUSCULAR | Status: AC
  Administered 2020-10-27: 1000 ug via INTRAMUSCULAR
  Filled 2020-10-27: qty 1

## 2020-10-27 NOTE — Progress Notes (Signed)
Amy Andersen presents today for injection per the provider's orders.  B12 administration without incident; injection site WNL; see MAR for injection details.  Patient tolerated procedure well and without incident.  No questions or complaints noted at this time.  Discharged ambulatory in stable condition.

## 2020-11-24 ENCOUNTER — Inpatient Hospital Stay (HOSPITAL_COMMUNITY): Payer: Medicaid Other | Attending: Internal Medicine

## 2020-11-24 ENCOUNTER — Other Ambulatory Visit: Payer: Self-pay

## 2020-11-24 VITALS — BP 129/65 | HR 99 | Temp 97.3°F | Resp 18

## 2020-11-24 DIAGNOSIS — D500 Iron deficiency anemia secondary to blood loss (chronic): Secondary | ICD-10-CM

## 2020-11-24 DIAGNOSIS — E538 Deficiency of other specified B group vitamins: Secondary | ICD-10-CM | POA: Insufficient documentation

## 2020-11-24 DIAGNOSIS — M94261 Chondromalacia, right knee: Secondary | ICD-10-CM | POA: Insufficient documentation

## 2020-11-24 MED ORDER — CYANOCOBALAMIN 1000 MCG/ML IJ SOLN
1000.0000 ug | Freq: Once | INTRAMUSCULAR | Status: AC
  Administered 2020-11-24: 1000 ug via INTRAMUSCULAR
  Filled 2020-11-24: qty 1

## 2020-11-24 NOTE — Progress Notes (Signed)
Patient tolerated injection with no complaints voiced.  Site clean and dry with no bruising or swelling noted.  No complaints of pain.  Discharged with vital signs stable and no signs or symptoms of distress noted.   

## 2020-12-25 ENCOUNTER — Ambulatory Visit (HOSPITAL_COMMUNITY): Payer: Medicaid Other

## 2021-01-25 ENCOUNTER — Inpatient Hospital Stay (HOSPITAL_COMMUNITY): Payer: Medicaid Other | Attending: Internal Medicine

## 2021-01-25 ENCOUNTER — Other Ambulatory Visit: Payer: Self-pay

## 2021-01-25 ENCOUNTER — Encounter (HOSPITAL_COMMUNITY): Payer: Self-pay

## 2021-01-25 VITALS — BP 140/89 | HR 103 | Temp 97.5°F | Resp 18

## 2021-01-25 DIAGNOSIS — E538 Deficiency of other specified B group vitamins: Secondary | ICD-10-CM | POA: Diagnosis present

## 2021-01-25 DIAGNOSIS — D500 Iron deficiency anemia secondary to blood loss (chronic): Secondary | ICD-10-CM

## 2021-01-25 MED ORDER — CYANOCOBALAMIN 1000 MCG/ML IJ SOLN
1000.0000 ug | Freq: Once | INTRAMUSCULAR | Status: AC
  Administered 2021-01-25: 1000 ug via INTRAMUSCULAR

## 2021-01-25 NOTE — Patient Instructions (Signed)
[facility] at [facility] Discharge Instructions  Received Vit B12 injection today. Follow-up as scheduled   Thank you for choosing [facility] at [facility] to provide your oncology and hematology care.  To afford each patient quality time with our provider, please arrive at least 15 minutes before your scheduled appointment time.   If you have a lab appointment with the Cancer Center please come in thru the Main Entrance and check in at the main information desk.  You need to re-schedule your appointment should you arrive 10 or more minutes late.  We strive to give you quality time with our providers, and arriving late affects you and other patients whose appointments are after yours.  Also, if you no show three or more times for appointments you may be dismissed from the clinic at the providers discretion.     Again, thank you for choosing [facility].  Our hope is that these requests will decrease the amount of time that you wait before being seen by our physicians.       _____________________________________________________________  Should you have questions after your visit to  Cancer Center, please contact our office at [phone] and follow the prompts.  Our office hours are 8:00 a.m. and 4:30 p.m. Monday - Friday.  Please note that voicemails left after 4:00 p.m. may not be returned until the following business day.  We are closed weekends and major holidays.  You do have access to a nurse 24-7, just call the main number to the clinic [phone] and do not press any options, hold on the line and a nurse will answer the phone.    For prescription refill requests, have your pharmacy contact our office and allow 72 hours.    Due to Covid, you will need to wear a mask upon entering the hospital. If you do not have a mask, a mask will be given to you at the Main Entrance upon arrival. For doctor visits, patients may have  1 support person age 18 or older with them. For treatment visits, patients can not have anyone with them due to social distancing guidelines and our immunocompromised population.     

## 2021-01-25 NOTE — Progress Notes (Signed)
Amy Andersen tolerated Vit B12 injection well without complaints or incident. VSS Pt discharged self ambulatory in satisfactory condition

## 2021-02-22 ENCOUNTER — Ambulatory Visit (HOSPITAL_COMMUNITY): Payer: Medicaid Other

## 2021-03-22 ENCOUNTER — Other Ambulatory Visit: Payer: Self-pay

## 2021-03-22 ENCOUNTER — Inpatient Hospital Stay (HOSPITAL_COMMUNITY): Payer: Medicaid Other | Attending: Hematology

## 2021-03-22 ENCOUNTER — Encounter (HOSPITAL_COMMUNITY): Payer: Self-pay

## 2021-03-22 ENCOUNTER — Inpatient Hospital Stay (HOSPITAL_COMMUNITY): Payer: Medicaid Other

## 2021-03-22 VITALS — BP 140/82 | HR 107 | Temp 97.0°F | Resp 18

## 2021-03-22 DIAGNOSIS — D509 Iron deficiency anemia, unspecified: Secondary | ICD-10-CM | POA: Diagnosis not present

## 2021-03-22 DIAGNOSIS — E538 Deficiency of other specified B group vitamins: Secondary | ICD-10-CM | POA: Insufficient documentation

## 2021-03-22 DIAGNOSIS — F17210 Nicotine dependence, cigarettes, uncomplicated: Secondary | ICD-10-CM | POA: Insufficient documentation

## 2021-03-22 DIAGNOSIS — D500 Iron deficiency anemia secondary to blood loss (chronic): Secondary | ICD-10-CM

## 2021-03-22 DIAGNOSIS — E559 Vitamin D deficiency, unspecified: Secondary | ICD-10-CM

## 2021-03-22 LAB — CBC WITH DIFFERENTIAL/PLATELET
Abs Immature Granulocytes: 0.01 10*3/uL (ref 0.00–0.07)
Basophils Absolute: 0.1 10*3/uL (ref 0.0–0.1)
Basophils Relative: 1 %
Eosinophils Absolute: 0.2 10*3/uL (ref 0.0–0.5)
Eosinophils Relative: 3 %
HCT: 48.2 % — ABNORMAL HIGH (ref 36.0–46.0)
Hemoglobin: 16.3 g/dL — ABNORMAL HIGH (ref 12.0–15.0)
Immature Granulocytes: 0 %
Lymphocytes Relative: 32 %
Lymphs Abs: 2.3 10*3/uL (ref 0.7–4.0)
MCH: 32.8 pg (ref 26.0–34.0)
MCHC: 33.8 g/dL (ref 30.0–36.0)
MCV: 97 fL (ref 80.0–100.0)
Monocytes Absolute: 0.3 10*3/uL (ref 0.1–1.0)
Monocytes Relative: 4 %
Neutro Abs: 4.3 10*3/uL (ref 1.7–7.7)
Neutrophils Relative %: 60 %
Platelets: 305 10*3/uL (ref 150–400)
RBC: 4.97 MIL/uL (ref 3.87–5.11)
RDW: 12.1 % (ref 11.5–15.5)
WBC: 7.2 10*3/uL (ref 4.0–10.5)
nRBC: 0 % (ref 0.0–0.2)

## 2021-03-22 LAB — IRON AND TIBC
Iron: 94 ug/dL (ref 28–170)
Saturation Ratios: 27 % (ref 10.4–31.8)
TIBC: 352 ug/dL (ref 250–450)
UIBC: 258 ug/dL

## 2021-03-22 LAB — VITAMIN B12: Vitamin B-12: >7500 pg/mL — ABNORMAL HIGH (ref 180–914)

## 2021-03-22 LAB — FERRITIN: Ferritin: 98 ng/mL (ref 11–307)

## 2021-03-22 MED ORDER — CYANOCOBALAMIN 1000 MCG/ML IJ SOLN
1000.0000 ug | Freq: Once | INTRAMUSCULAR | Status: AC
  Administered 2021-03-22: 1000 ug via INTRAMUSCULAR
  Filled 2021-03-22: qty 1

## 2021-03-22 NOTE — Progress Notes (Signed)
Patient tolerated B12 injection with no complaints voiced. Site clean and dry with no bruising or swelling noted at site. See MAR for details. Band aid applied.  Patient stable during and after injection. VSS with discharge and left in satisfactory condition with no s/s of distress noted. 

## 2021-03-22 NOTE — Patient Instructions (Signed)
[facility] at [facility]  Discharge Instructions:  You received your B12 injection today. Return as scheduled. _______________________________________________________________  Thank you for choosing [facility] at [facility] to provide your oncology and hematology care.  To afford each patient quality time with our providers, please arrive at least 15 minutes before your scheduled appointment.  You need to re-schedule your appointment if you arrive 10 or more minutes late.  We strive to give you quality time with our providers, and arriving late affects you and other patients whose appointments are after yours.  Also, if you no show three or more times for appointments you may be dismissed from the clinic.  Again, thank you for choosing [facility] at [facility]. Our hope is that these requests will allow you access to exceptional care and in a timely manner. _______________________________________________________________  If you have questions after your visit, please contact our office at [phone] between the hours of 8:30 a.m. and 5:00 p.m. Voicemails left after 4:30 p.m. will not be returned until the following business day. _______________________________________________________________  For prescription refill requests, have your pharmacy contact our office. _______________________________________________________________  Recommendations made by the consultant and any test results will be sent to your referring physician. _______________________________________________________________ 

## 2021-03-28 ENCOUNTER — Inpatient Hospital Stay (HOSPITAL_BASED_OUTPATIENT_CLINIC_OR_DEPARTMENT_OTHER): Payer: Medicaid Other | Admitting: Hematology

## 2021-03-28 ENCOUNTER — Other Ambulatory Visit: Payer: Self-pay

## 2021-03-28 ENCOUNTER — Encounter (HOSPITAL_COMMUNITY): Payer: Self-pay | Admitting: Hematology

## 2021-03-28 DIAGNOSIS — D500 Iron deficiency anemia secondary to blood loss (chronic): Secondary | ICD-10-CM | POA: Diagnosis not present

## 2021-03-28 DIAGNOSIS — E538 Deficiency of other specified B group vitamins: Secondary | ICD-10-CM

## 2021-03-28 DIAGNOSIS — D509 Iron deficiency anemia, unspecified: Secondary | ICD-10-CM | POA: Diagnosis not present

## 2021-03-28 NOTE — Patient Instructions (Addendum)
[facility] at [facility] Discharge Instructions  You were seen today by Dr. Ellin Saba. He went over your recent results. Stop getting the vitamin B12 injections. Purchase vitamin B12 1,000 mcg (1 mg) over the counter and take 1 tablet daily. Dr. Ellin Saba will see you back in 6 months for labs and follow up.   Thank you for choosing [facility] at [facility] to provide your oncology and hematology care.  To afford each patient quality time with our provider, please arrive at least 15 minutes before your scheduled appointment time.   If you have a lab appointment with the Cancer Center please come in thru the Main Entrance and check in at the main information desk  You need to re-schedule your appointment should you arrive 10 or more minutes late.  We strive to give you quality time with our providers, and arriving late affects you and other patients whose appointments are after yours.  Also, if you no show three or more times for appointments you may be dismissed from the clinic at the providers discretion.     Again, thank you for choosing [facility].  Our hope is that these requests will decrease the amount of time that you wait before being seen by our physicians.       _____________________________________________________________  Should you have questions after your visit to [facility], please contact our office at [phone] between the hours of 8:00 a.m. and 4:30 p.m.  Voicemails left after 4:00 p.m. will not be returned until the following business day.  For prescription refill requests, have your pharmacy contact our office and allow 72 hours.    Cancer Center Support Programs:   > Cancer Support Group  2nd Tuesday of the month 1pm-2pm, Journey Room

## 2021-03-28 NOTE — Progress Notes (Signed)
[facility] [address], [place] [address]   CLINIC:  Medical Oncology/Hematology  PCP:  Patient, No Pcp Per (Inactive) None  None  REASON FOR VISIT:  Follow-up for IDA  PRIOR THERAPY: Iron tablets  CURRENT THERAPY: Intermittent Feraheme last on 10/06/2020  INTERVAL HISTORY:  [name], a 45 y.o. female, returns for routine follow-up for her IDA. Amy Andersen was last seen on 09/26/2020.  Today she is accompanied by her daughter and she reports feeling okay. She is getting vitamin B12 injections every other month. She had a right knee arthroscopy on 03/07 and tolerated it well; she is currently going to PT. She continues having menses without heavy bleeding and denies having hematochezia or hematuria.   REVIEW OF SYSTEMS:  Review of Systems  Constitutional: Positive for appetite change (50%) and fatigue (25%).  Gastrointestinal: Negative for blood in stool.  Genitourinary: Negative for hematuria and menstrual problem.   Musculoskeletal: Positive for arthralgias (8/10 R knee pain s/p arthroscopy).  Psychiatric/Behavioral: Positive for sleep disturbance.  All other systems reviewed and are negative.   PAST MEDICAL/SURGICAL HISTORY:  Past Medical History:  Diagnosis Date  . Anemia   . Chronic pain    Past Surgical History:  Procedure Laterality Date  . DILATION AND CURETTAGE OF UTERUS      SOCIAL HISTORY:  Social History   Socioeconomic History  . Marital status: Married    Spouse name: Tasia Catchings  . Number of children: 3  . Years of education: 9  . Highest education level: 9th grade  Occupational History  . Occupation: home maker   Tobacco Use  . Smoking status: Current Every Day Smoker    Packs/day: 0.50    Types: Cigarettes  . Smokeless tobacco: Never Used  Vaping Use  . Vaping Use: Never used  Substance and Sexual Activity  . Alcohol use: No  . Drug use: No  . Sexual activity: Yes    Birth control/protection: None   Other Topics Concern  . Not on file  Social History Narrative  . Not on file   Social Determinants of Health   Financial Resource Strain: Low Risk   . Difficulty of Paying Living Expenses: Not very hard  Food Insecurity: No Food Insecurity  . Worried About Programme researcher, broadcasting/film/video in the Last Year: Never true  . Ran Out of Food in the Last Year: Never true  Transportation Needs: No Transportation Needs  . Lack of Transportation (Medical): No  . Lack of Transportation (Non-Medical): No  Physical Activity: Insufficiently Active  . Days of Exercise per Week: 4 days  . Minutes of Exercise per Session: 30 min  Stress: No Stress Concern Present  . Feeling of Stress : Not at all  Social Connections: Moderately Integrated  . Frequency of Communication with Friends and Family: Three times a week  . Frequency of Social Gatherings with Friends and Family: Once a week  . Attends Religious Services: 1 to 4 times per year  . Active Member of Clubs or Organizations: No  . Attends Banker Meetings: Never  . Marital Status: Married  Catering manager Violence: Not At Risk  . Fear of Current or Ex-Partner: No  . Emotionally Abused: No  . Physically Abused: No  . Sexually Abused: No    FAMILY HISTORY:  Family History  Problem Relation Age of Onset  . Hypertension Maternal Grandmother   . CAD Maternal Grandmother   . Cancer Maternal Grandfather   .  Hypertension Mother     CURRENT MEDICATIONS:  Current Outpatient Medications  Medication Sig Dispense Refill  . ergocalciferol (VITAMIN D2) 1.25 MG (50000 UT) capsule Take 1 capsule (50,000 Units total) by mouth once a week. 16 capsule 4  . HYDROcodone-acetaminophen (NORCO) 7.5-325 MG tablet Take 1 tablet by mouth 4 (four) times daily as needed.    . RESTASIS 0.05 % ophthalmic emulsion Place 1 drop into both eyes 2 (two) times daily.      No current facility-administered medications for this visit.    ALLERGIES:  No Known  Allergies  PHYSICAL EXAM:  Performance status (ECOG): 1 - Symptomatic but completely ambulatory  Vitals:   03/28/21 1302  BP: (!) 145/81  Pulse: 91  Resp: 18  Temp: (!) 97.2 F (36.2 C)  SpO2: 97%   Wt Readings from Last 3 Encounters:  03/28/21 189 lb 3.2 oz (85.8 kg)  09/26/20 197 lb 12.8 oz (89.7 kg)  05/23/20 201 lb 9.6 oz (91.4 kg)   Physical Exam Vitals reviewed.  Constitutional:      Appearance: Normal appearance.     Comments: In wheelchair  Cardiovascular:     Rate and Rhythm: Normal rate and regular rhythm.     Pulses: Normal pulses.     Heart sounds: Normal heart sounds.  Pulmonary:     Effort: Pulmonary effort is normal.     Breath sounds: Normal breath sounds.  Musculoskeletal:     Right lower leg: No edema.     Left lower leg: No edema.  Neurological:     General: No focal deficit present.     Mental Status: She is alert and oriented to person, place, and time.  Psychiatric:        Mood and Affect: Mood normal.        Behavior: Behavior normal.     LABORATORY DATA:  I have reviewed the labs as listed.  CBC Latest Ref Rng & Units 03/22/2021 09/20/2020 05/18/2020  WBC 4.0 - 10.5 K/uL 7.2 8.1 5.4  Hemoglobin 12.0 - 15.0 g/dL 16.3(H) 15.5(H) 14.8  Hematocrit 36.0 - 46.0 % 48.2(H) 46.9(H) 46.5(H)  Platelets 150 - 400 K/uL 305 263 242   CMP Latest Ref Rng & Units 09/20/2020 05/18/2020 02/10/2020  Glucose 70 - 99 mg/dL 973(Z) 329(J) 242(A)  BUN 6 - 20 mg/dL 9 12 9   Creatinine 0.44 - 1.00 mg/dL 8.34 1.96  Sodium 135 - 145 mmol/L 138 136 137  Potassium 3.5 - 5.1 mmol/L 4.2 3.8 3.3(L)  Chloride 98 - 111 mmol/L 103 101 101  CO2 22 - 32 mmol/L 29 27 27   Calcium 8.9 - 10.3 mg/dL 9.5 9.4 9.1  Total Protein 6.5 - 8.1 g/dL 7.7 7.3 7.7  Total Bilirubin 0.3 - 1.2 mg/dL 0.6 0.5 0.5  Alkaline Phos 38 - 126 U/L 55 61 69  AST 15 - 41 U/L 14(L) 28 18  ALT 0 - 44 U/L 14 43 18      Component Value Date/Time   RBC 4.97 03/22/2021 0943   MCV 97.0 03/22/2021 0943    MCH 32.8 03/22/2021 0943   MCHC 33.8 03/22/2021 0943   RDW 12.1 03/22/2021 0943   LYMPHSABS 2.3 03/22/2021 0943   MONOABS 0.3 03/22/2021 0943   EOSABS 0.2 03/22/2021 0943   BASOSABS 0.1 03/22/2021 0943   Lab Results  Component Value Date   TIBC 352 03/22/2021   TIBC 403 09/20/2020   TIBC 353 05/18/2020   FERRITIN 98 03/22/2021   FERRITIN 12  09/20/2020   FERRITIN 29 05/18/2020   IRONPCTSAT 27 03/22/2021   IRONPCTSAT 14 09/20/2020   IRONPCTSAT 37 (H) 05/18/2020    DIAGNOSTIC IMAGING:  I have independently reviewed the scans and discussed with the patient. No results found.   ASSESSMENT:  1. Severe iron deficiency anemia: -She was found to be severely anemic with a hemoglobin of 6.8 in March 2019. She was started on oral iron therapy. Her hemoglobin on 03/27/2018 improved to 8.9. -Her most recent blood work on 06/04/2018 showed ferritin of 5, TIBC of 505, vitamin B12 of 233. Folic acid was normal. -She is actively menstruating, with 7/28 days. She has heavy bleeding 2 days. Denies any rectal bleeding or hematuria. -Last Feraheme on 05/31/2020.  2. Vitamin B12 deficiency: -She had a history of B12 deficiency and has been on B12 injections.   PLAN:  1. Severe iron deficiency anemia: -Last Feraheme on 09/28/2020 and 10/06/2020. -Reviewed her CBC from 03/22/2021.  Hemoglobin increased to 16.3 and hematocrit 48.2. -Ferritin is 98 and percent saturation 27. -No indication for parenteral iron therapy. -RTC 6 months with repeat CBC, ferritin and iron panel.  2. Vitamin B12 deficiency: -Her B12 level is 7500. -We will discontinue B12 injections. -We will start her on B12 1 mg p.o. daily.  Plan to recheck B12 in 6 months.  Orders placed this encounter:  No orders of the defined types were placed in this encounter.    Doreatha Massed, MD [facility] [phone]   I, Drue Second, am acting as a scribe for Dr. Payton Mccallum.  I,  Doreatha Massed MD, have reviewed the above documentation for accuracy and completeness, and I agree with the above.

## 2021-03-29 ENCOUNTER — Ambulatory Visit (HOSPITAL_COMMUNITY): Payer: Medicaid Other | Admitting: Hematology

## 2021-04-03 DIAGNOSIS — Z9889 Other specified postprocedural states: Secondary | ICD-10-CM | POA: Insufficient documentation

## 2021-04-20 ENCOUNTER — Ambulatory Visit (HOSPITAL_COMMUNITY): Payer: Medicaid Other

## 2021-09-26 ENCOUNTER — Other Ambulatory Visit: Payer: Self-pay

## 2021-09-26 ENCOUNTER — Inpatient Hospital Stay (HOSPITAL_COMMUNITY): Payer: Medicaid Other | Attending: Hematology

## 2021-09-26 DIAGNOSIS — E538 Deficiency of other specified B group vitamins: Secondary | ICD-10-CM | POA: Insufficient documentation

## 2021-09-26 DIAGNOSIS — D509 Iron deficiency anemia, unspecified: Secondary | ICD-10-CM | POA: Diagnosis present

## 2021-09-26 DIAGNOSIS — F17210 Nicotine dependence, cigarettes, uncomplicated: Secondary | ICD-10-CM | POA: Insufficient documentation

## 2021-09-26 DIAGNOSIS — D500 Iron deficiency anemia secondary to blood loss (chronic): Secondary | ICD-10-CM

## 2021-09-26 LAB — CBC WITH DIFFERENTIAL/PLATELET
Abs Immature Granulocytes: 0.01 10*3/uL (ref 0.00–0.07)
Basophils Absolute: 0.1 10*3/uL (ref 0.0–0.1)
Basophils Relative: 1 %
Eosinophils Absolute: 0.3 10*3/uL (ref 0.0–0.5)
Eosinophils Relative: 4 %
HCT: 48.7 % — ABNORMAL HIGH (ref 36.0–46.0)
Hemoglobin: 16.2 g/dL — ABNORMAL HIGH (ref 12.0–15.0)
Immature Granulocytes: 0 %
Lymphocytes Relative: 31 %
Lymphs Abs: 2.4 10*3/uL (ref 0.7–4.0)
MCH: 32.3 pg (ref 26.0–34.0)
MCHC: 33.3 g/dL (ref 30.0–36.0)
MCV: 97.2 fL (ref 80.0–100.0)
Monocytes Absolute: 0.5 10*3/uL (ref 0.1–1.0)
Monocytes Relative: 6 %
Neutro Abs: 4.7 10*3/uL (ref 1.7–7.7)
Neutrophils Relative %: 58 %
Platelets: 294 10*3/uL (ref 150–400)
RBC: 5.01 MIL/uL (ref 3.87–5.11)
RDW: 12.2 % (ref 11.5–15.5)
WBC: 8 10*3/uL (ref 4.0–10.5)
nRBC: 0 % (ref 0.0–0.2)

## 2021-09-26 LAB — VITAMIN B12: Vitamin B-12: 345 pg/mL (ref 180–914)

## 2021-09-26 LAB — IRON AND TIBC
Iron: 46 ug/dL (ref 28–170)
Saturation Ratios: 12 % (ref 10.4–31.8)
TIBC: 395 ug/dL (ref 250–450)
UIBC: 349 ug/dL

## 2021-09-26 LAB — FERRITIN: Ferritin: 33 ng/mL (ref 11–307)

## 2021-10-02 NOTE — Progress Notes (Signed)
[facility] [address], [place] [address]   CLINIC:  Medical Oncology/Hematology  PCP:  Patient, No Pcp Per (Inactive) None  None  REASON FOR VISIT:  Follow-up for IDA  PRIOR THERAPY: Iron tablets  CURRENT THERAPY: Intermittent Feraheme last on 10/06/2020  INTERVAL HISTORY:  Amy Andersen, a 45 y.o. female, returns for routine follow-up for her IDA. Amy Andersen was last seen on 03/28/2021.  Today she reports feeling well. She has been taking iron tablets and reports nausea although she denies vomiting and constipation. She reports regular menses with moderate flow. She reports fatigue and cravings for ice as well as sweets, and she denies hematuria and black stools.  REVIEW OF SYSTEMS:  Review of Systems  Constitutional:  Positive for fatigue (25%). Negative for appetite change (50%).  Gastrointestinal:  Positive for nausea. Negative for constipation and vomiting.  Genitourinary:  Negative for hematuria and menstrual problem.   Musculoskeletal:  Positive for arthralgias (8/10 R knee).  Neurological:  Positive for dizziness.  All other systems reviewed and are negative.  PAST MEDICAL/SURGICAL HISTORY:  Past Medical History:  Diagnosis Date   Anemia    Chronic pain    Past Surgical History:  Procedure Laterality Date   DILATION AND CURETTAGE OF UTERUS      SOCIAL HISTORY:  Social History   Socioeconomic History   Marital status: Married    Spouse name: Tasia Catchings   Number of children: 3   Years of education: 9   Highest education level: 9th grade  Occupational History   Occupation: Arts development officer   Tobacco Use   Smoking status: Every Day    Packs/day: 0.50    Types: Cigarettes   Smokeless tobacco: Never  Vaping Use   Vaping Use: Never used  Substance and Sexual Activity   Alcohol use: No   Drug use: No   Sexual activity: Yes    Birth control/protection: None  Other Topics Concern   Not on file  Social History Narrative   Not  on file   Social Determinants of Health   Financial Resource Strain: Not on file  Food Insecurity: Not on file  Transportation Needs: Not on file  Physical Activity: Not on file  Stress: Not on file  Social Connections: Not on file  Intimate Partner Violence: Not on file    FAMILY HISTORY:  Family History  Problem Relation Age of Onset   Hypertension Maternal Grandmother    CAD Maternal Grandmother    Cancer Maternal Grandfather    Hypertension Mother     CURRENT MEDICATIONS:  Current Outpatient Medications  Medication Sig Dispense Refill   ergocalciferol (VITAMIN D2) 1.25 MG (50000 UT) capsule Take 1 capsule (50,000 Units total) by mouth once a week. 16 capsule 4   HYDROcodone-acetaminophen (NORCO) 7.5-325 MG tablet Take 1 tablet by mouth 4 (four) times daily as needed.     RESTASIS 0.05 % ophthalmic emulsion Place 1 drop into both eyes 2 (two) times daily.      No current facility-administered medications for this visit.    ALLERGIES:  No Known Allergies  PHYSICAL EXAM:  Performance status (ECOG): 1 - Symptomatic but completely ambulatory  There were no vitals filed for this visit. Wt Readings from Last 3 Encounters:  03/28/21 189 lb 3.2 oz (85.8 kg)  09/26/20 197 lb 12.8 oz (89.7 kg)  05/23/20 201 lb 9.6 oz (91.4 kg)   Physical Exam Vitals reviewed.  Constitutional:      Appearance:  Normal appearance.  Cardiovascular:     Rate and Rhythm: Normal rate and regular rhythm.     Pulses: Normal pulses.     Heart sounds: Normal heart sounds.  Pulmonary:     Effort: Pulmonary effort is normal.     Breath sounds: Normal breath sounds.  Neurological:     General: No focal deficit present.     Mental Status: She is alert and oriented to person, place, and time.  Psychiatric:        Mood and Affect: Mood normal.        Behavior: Behavior normal.    LABORATORY DATA:  I have reviewed the labs as listed.  CBC Latest Ref Rng & Units 09/26/2021 03/22/2021 09/20/2020   WBC 4.0 - 10.5 K/uL 8.0 7.2 8.1  Hemoglobin 12.0 - 15.0 g/dL 16.2(H) 16.3(H) 15.5(H)  Hematocrit 36.0 - 46.0 % 48.7(H) 48.2(H) 46.9(H)  Platelets 150 - 400 K/uL 294 305 263   CMP Latest Ref Rng & Units 09/20/2020 05/18/2020 02/10/2020  Glucose 70 - 99 mg/dL 673(A) 193(X) 902(I)  BUN 6 - 20 mg/dL 9 12 9   Creatinine 0.44 - 1.00 mg/dL 0.97 3.53  Sodium 135 - 145 mmol/L 138 136 137  Potassium 3.5 - 5.1 mmol/L 4.2 3.8 3.3(L)  Chloride 98 - 111 mmol/L 103 101 101  CO2 22 - 32 mmol/L 29 27 27   Calcium 8.9 - 10.3 mg/dL 9.5 9.4 9.1  Total Protein 6.5 - 8.1 g/dL 7.7 7.3 7.7  Total Bilirubin 0.3 - 1.2 mg/dL 0.6 0.5 0.5  Alkaline Phos 38 - 126 U/L 55 61 69  AST 15 - 41 U/L 14(L) 28 18  ALT 0 - 44 U/L 14 43 18      Component Value Date/Time   RBC 5.01 09/26/2021 1312   MCV 97.2 09/26/2021 1312   MCH 32.3 09/26/2021 1312   MCHC 33.3 09/26/2021 1312   RDW 12.2 09/26/2021 1312   LYMPHSABS 2.4 09/26/2021 1312   MONOABS 0.5 09/26/2021 1312   EOSABS 0.3 09/26/2021 1312   BASOSABS 0.1 09/26/2021 1312    DIAGNOSTIC IMAGING:  I have independently reviewed the scans and discussed with the patient. No results found.   ASSESSMENT:  1.  Severe iron deficiency anemia: - She was found to be severely anemic with a hemoglobin of 6.8 in March 2019.  She was started on oral iron therapy.  Her hemoglobin on 03/27/2018 improved to 8.9. - Her most recent blood work on 06/04/2018 showed ferritin of 5, TIBC of 505, vitamin B12 of 233.  Folic acid was normal. - She is actively menstruating, with 7/28 days.  She has heavy bleeding 2 days.  Denies any rectal bleeding or hematuria. -Last Feraheme on 05/31/2020.   2.  Vitamin B12 deficiency: -She had a history of B12 deficiency and has been on B12 injections.   PLAN:  1.  Severe iron deficiency anemia: - Last Feraheme on 10/06/2020. - She continues to have regular menses with moderate flow. - She takes iron tablet daily.  It makes her feel sick. - She  also complains of feeling severe tiredness.  She has ice and sweet pica which has gotten worse. - Recommend Venofer 300 mg, 300 mg and 400 mg. - RTC 6 months with repeat labs.   2.  Vitamin B12 deficiency: - Continue B12 1 mg tablet daily. - B12 level today 345.  Orders placed this encounter:  No orders of the defined types were placed in this encounter.  Derek Jack, MD [facility] [phone]   I, Thana Ates, am acting as a scribe for Dr. Derek Jack.  I, Derek Jack MD, have reviewed the above documentation for accuracy and completeness, and I agree with the above.

## 2021-10-03 ENCOUNTER — Inpatient Hospital Stay (HOSPITAL_BASED_OUTPATIENT_CLINIC_OR_DEPARTMENT_OTHER): Payer: Medicaid Other | Admitting: Hematology

## 2021-10-03 ENCOUNTER — Other Ambulatory Visit: Payer: Self-pay

## 2021-10-03 VITALS — BP 156/93 | HR 95 | Temp 99.0°F | Resp 20 | Wt 202.6 lb

## 2021-10-03 DIAGNOSIS — E538 Deficiency of other specified B group vitamins: Secondary | ICD-10-CM

## 2021-10-03 DIAGNOSIS — D509 Iron deficiency anemia, unspecified: Secondary | ICD-10-CM | POA: Diagnosis not present

## 2021-10-03 DIAGNOSIS — D500 Iron deficiency anemia secondary to blood loss (chronic): Secondary | ICD-10-CM

## 2021-10-03 NOTE — Patient Instructions (Signed)
[facility] at [facility] Discharge Instructions  You were seen and examined today by Dr. Ellin Saba. He wants you to continue taking vitamin B12. We are going to get you scheduled for IV iron. Please keep follow up as scheduled.   Thank you for choosing [facility] at [facility] to provide your oncology and hematology care.  To afford each patient quality time with our provider, please arrive at least 15 minutes before your scheduled appointment time.   If you have a lab appointment with the Cancer Center please come in thru the Main Entrance and check in at the main information desk.  You need to re-schedule your appointment should you arrive 10 or more minutes late.  We strive to give you quality time with our providers, and arriving late affects you and other patients whose appointments are after yours.  Also, if you no show three or more times for appointments you may be dismissed from the clinic at the providers discretion.     Again, thank you for choosing [facility].  Our hope is that these requests will decrease the amount of time that you wait before being seen by our physicians.       _____________________________________________________________  Should you have questions after your visit to [facility], please contact our office at [phone] and follow the prompts.  Our office hours are 8:00 a.m. and 4:30 p.m. Monday - Friday.  Please note that voicemails left after 4:00 p.m. may not be returned until the following business day.  We are closed weekends and major holidays.  You do have access to a nurse 24-7, just call the main number to the clinic [phone] and do not press any options, hold on the line and a nurse will answer the phone.    For prescription refill requests, have your pharmacy contact our office and allow 72 hours.    Due to Covid, you will need to wear a mask upon entering the hospital. If  you do not have a mask, a mask will be given to you at the Main Entrance upon arrival. For doctor visits, patients may have 1 support person age 20 or older with them. For treatment visits, patients can not have anyone with them due to social distancing guidelines and our immunocompromised population.

## 2021-10-05 ENCOUNTER — Inpatient Hospital Stay (HOSPITAL_COMMUNITY): Payer: Medicaid Other

## 2021-10-05 ENCOUNTER — Encounter (HOSPITAL_COMMUNITY): Payer: Self-pay

## 2021-10-05 ENCOUNTER — Other Ambulatory Visit: Payer: Self-pay

## 2021-10-05 VITALS — BP 144/82 | HR 78 | Temp 98.4°F | Resp 18

## 2021-10-05 DIAGNOSIS — D509 Iron deficiency anemia, unspecified: Secondary | ICD-10-CM | POA: Diagnosis not present

## 2021-10-05 DIAGNOSIS — D500 Iron deficiency anemia secondary to blood loss (chronic): Secondary | ICD-10-CM

## 2021-10-05 MED ORDER — SODIUM CHLORIDE 0.9 % IV SOLN: Freq: Once | INTRAVENOUS | Status: AC

## 2021-10-05 MED ORDER — SODIUM CHLORIDE 0.9 % IV SOLN
300.0000 mg | Freq: Once | INTRAVENOUS | Status: AC
  Administered 2021-10-05: 300 mg via INTRAVENOUS
  Filled 2021-10-05: qty 300

## 2021-10-05 NOTE — Patient Instructions (Signed)
[facility]  Discharge Instructions: Thank you for choosing [facility] to provide your oncology and hematology care.  If you have a lab appointment with the Cancer Center, please come in thru the Main Entrance and check in at the main information desk.  Wear comfortable clothing and clothing appropriate for easy access to any Portacath or PICC line.   We strive to give you quality time with your provider. You may need to reschedule your appointment if you arrive late (15 or more minutes).  Arriving late affects you and other patients whose appointments are after yours.  Also, if you miss three or more appointments without notifying the office, you may be dismissed from the clinic at the provider's discretion.      For prescription refill requests, have your pharmacy contact our office and allow 72 hours for refills to be completed.    Today you received the following: Venofer, return as scheduled.   To help prevent nausea and vomiting after your treatment, we encourage you to take your nausea medication as directed.  BELOW ARE SYMPTOMS THAT SHOULD BE REPORTED IMMEDIATELY: *FEVER GREATER THAN 100.4 F (38 C) OR HIGHER *CHILLS OR SWEATING *NAUSEA AND VOMITING THAT IS NOT CONTROLLED WITH YOUR NAUSEA MEDICATION *UNUSUAL SHORTNESS OF BREATH *UNUSUAL BRUISING OR BLEEDING *URINARY PROBLEMS (pain or burning when urinating, or frequent urination) *BOWEL PROBLEMS (unusual diarrhea, constipation, pain near the anus) TENDERNESS IN MOUTH AND THROAT WITH OR WITHOUT PRESENCE OF ULCERS (sore throat, sores in mouth, or a toothache) UNUSUAL RASH, SWELLING OR PAIN  UNUSUAL VAGINAL DISCHARGE OR ITCHING   Items with * indicate a potential emergency and should be followed up as soon as possible or go to the Emergency Department if any problems should occur.  Please show the CHEMOTHERAPY ALERT CARD or IMMUNOTHERAPY ALERT CARD at check-in to the Emergency Department and triage  nurse.  Should you have questions after your visit or need to cancel or reschedule your appointment, please contact [facility] [phone]  and follow the prompts.  Office hours are 8:00 a.m. to 4:30 p.m. Monday - Friday. Please note that voicemails left after 4:00 p.m. may not be returned until the following business day.  We are closed weekends and major holidays. You have access to a nurse at all times for urgent questions. Please call the main number to the clinic [phone] and follow the prompts.  For any non-urgent questions, you may also contact your provider using MyChart. We now offer e-Visits for anyone 18 and older to request care online for non-urgent symptoms. For details visit [email or web address].   Also download the MyChart app! Go to the app store, search "MyChart", open the app, select [facility], and log in with your MyChart username and password.  Due to Covid, a mask is required upon entering the hospital/clinic. If you do not have a mask, one will be given to you upon arrival. For doctor visits, patients may have 1 support person aged 18 or older with them. For treatment visits, patients cannot have anyone with them due to current Covid guidelines and our immunocompromised population.  

## 2021-10-05 NOTE — Progress Notes (Signed)
Patient tolerated iron infusion with no complaints voiced.  Peripheral IV site clean and dry with good blood return noted before and after infusion.  Band aid applied.  VSS with discharge and left in satisfactory condition with no s/s of distress noted.   

## 2021-10-11 MED FILL — Iron Sucrose Inj 20 MG/ML (Fe Equiv): INTRAVENOUS | Qty: 15 | Status: AC

## 2021-10-12 ENCOUNTER — Inpatient Hospital Stay (HOSPITAL_COMMUNITY): Payer: Medicaid Other

## 2021-10-12 ENCOUNTER — Other Ambulatory Visit: Payer: Self-pay

## 2021-10-12 VITALS — BP 153/80 | HR 75 | Temp 98.1°F | Resp 20

## 2021-10-12 DIAGNOSIS — D500 Iron deficiency anemia secondary to blood loss (chronic): Secondary | ICD-10-CM

## 2021-10-12 DIAGNOSIS — D509 Iron deficiency anemia, unspecified: Secondary | ICD-10-CM | POA: Diagnosis not present

## 2021-10-12 MED ORDER — SODIUM CHLORIDE 0.9 % IV SOLN: Freq: Once | INTRAVENOUS | Status: AC

## 2021-10-12 MED ORDER — IRON SUCROSE 20 MG/ML IV SOLN
300.0000 mg | Freq: Once | INTRAVENOUS | Status: AC
  Administered 2021-10-12: 300 mg via INTRAVENOUS
  Filled 2021-10-12: qty 300

## 2021-10-12 NOTE — Patient Instructions (Signed)
[facility]  Discharge Instructions: Thank you for choosing [facility] to provide your oncology and hematology care.  If you have a lab appointment with the Cancer Center, please come in thru the Main Entrance and check in at the main information desk.  Wear comfortable clothing and clothing appropriate for easy access to any Portacath or PICC line.   We strive to give you quality time with your provider. You may need to reschedule your appointment if you arrive late (15 or more minutes).  Arriving late affects you and other patients whose appointments are after yours.  Also, if you miss three or more appointments without notifying the office, you may be dismissed from the clinic at the provider's discretion.      For prescription refill requests, have your pharmacy contact our office and allow 72 hours for refills to be completed.        To help prevent nausea and vomiting after your treatment, we encourage you to take your nausea medication as directed.  BELOW ARE SYMPTOMS THAT SHOULD BE REPORTED IMMEDIATELY: *FEVER GREATER THAN 100.4 F (38 C) OR HIGHER *CHILLS OR SWEATING *NAUSEA AND VOMITING THAT IS NOT CONTROLLED WITH YOUR NAUSEA MEDICATION *UNUSUAL SHORTNESS OF BREATH *UNUSUAL BRUISING OR BLEEDING *URINARY PROBLEMS (pain or burning when urinating, or frequent urination) *BOWEL PROBLEMS (unusual diarrhea, constipation, pain near the anus) TENDERNESS IN MOUTH AND THROAT WITH OR WITHOUT PRESENCE OF ULCERS (sore throat, sores in mouth, or a toothache) UNUSUAL RASH, SWELLING OR PAIN  UNUSUAL VAGINAL DISCHARGE OR ITCHING   Items with * indicate a potential emergency and should be followed up as soon as possible or go to the Emergency Department if any problems should occur.  Please show the CHEMOTHERAPY ALERT CARD or IMMUNOTHERAPY ALERT CARD at check-in to the Emergency Department and triage nurse.  Should you have questions after your visit or need to cancel  or reschedule your appointment, please contact [facility] [phone]  and follow the prompts.  Office hours are 8:00 a.m. to 4:30 p.m. Monday - Friday. Please note that voicemails left after 4:00 p.m. may not be returned until the following business day.  We are closed weekends and major holidays. You have access to a nurse at all times for urgent questions. Please call the main number to the clinic [phone] and follow the prompts.  For any non-urgent questions, you may also contact your provider using MyChart. We now offer e-Visits for anyone 18 and older to request care online for non-urgent symptoms. For details visit [email or web address].   Also download the MyChart app! Go to the app store, search "MyChart", open the app, select , and log in with your MyChart username and password.  Due to Covid, a mask is required upon entering the hospital/clinic. If you do not have a mask, one will be given to you upon arrival. For doctor visits, patients may have 1 support person aged 18 or older with them. For treatment visits, patients cannot have anyone with them due to current Covid guidelines and our immunocompromised population.  

## 2021-10-12 NOTE — Progress Notes (Signed)
Patient presents today for iron infusion.  Patient is in satisfactory condition with no new complaints voiced.  Vital signs are stable.  We will proceed with treatment per MD orders.   Patient tolerated treatment well with no complaints voiced.  Patient left ambulatory with cane in stable condition.  Vital signs stable at discharge.  Follow up as scheduled.

## 2021-10-15 DIAGNOSIS — M25561 Pain in right knee: Secondary | ICD-10-CM | POA: Insufficient documentation

## 2021-10-19 ENCOUNTER — Encounter (HOSPITAL_COMMUNITY): Payer: Self-pay

## 2021-10-19 ENCOUNTER — Other Ambulatory Visit: Payer: Self-pay

## 2021-10-19 ENCOUNTER — Inpatient Hospital Stay (HOSPITAL_COMMUNITY): Payer: Medicaid Other | Attending: Hematology

## 2021-10-19 VITALS — BP 141/75 | HR 72 | Temp 97.3°F | Resp 16

## 2021-10-19 DIAGNOSIS — D509 Iron deficiency anemia, unspecified: Secondary | ICD-10-CM | POA: Diagnosis present

## 2021-10-19 DIAGNOSIS — D500 Iron deficiency anemia secondary to blood loss (chronic): Secondary | ICD-10-CM

## 2021-10-19 MED ORDER — SODIUM CHLORIDE 0.9 % IV SOLN
400.0000 mg | Freq: Once | INTRAVENOUS | Status: AC
  Administered 2021-10-19: 400 mg via INTRAVENOUS
  Filled 2021-10-19: qty 20

## 2021-10-19 MED ORDER — SODIUM CHLORIDE 0.9 % IV SOLN
Freq: Once | INTRAVENOUS | Status: AC
Start: 2021-10-19 — End: 2021-10-19

## 2021-10-19 MED ORDER — CYANOCOBALAMIN 1000 MCG/ML IJ SOLN: 1000.0000 ug | Freq: Once | INTRAMUSCULAR | Status: DC

## 2021-10-19 NOTE — Patient Instructions (Signed)
[facility]  Discharge Instructions: Thank you for choosing [facility] to provide your oncology and hematology care.  If you have a lab appointment with the Cancer Center, please come in thru the Main Entrance and check in at the main information desk.  Wear comfortable clothing and clothing appropriate for easy access to any Portacath or PICC line.   We strive to give you quality time with your provider. You may need to reschedule your appointment if you arrive late (15 or more minutes).  Arriving late affects you and other patients whose appointments are after yours.  Also, if you miss three or more appointments without notifying the office, you may be dismissed from the clinic at the provider's discretion.      For prescription refill requests, have your pharmacy contact our office and allow 72 hours for refills to be completed.        To help prevent nausea and vomiting after your treatment, we encourage you to take your nausea medication as directed.  BELOW ARE SYMPTOMS THAT SHOULD BE REPORTED IMMEDIATELY: *FEVER GREATER THAN 100.4 F (38 C) OR HIGHER *CHILLS OR SWEATING *NAUSEA AND VOMITING THAT IS NOT CONTROLLED WITH YOUR NAUSEA MEDICATION *UNUSUAL SHORTNESS OF BREATH *UNUSUAL BRUISING OR BLEEDING *URINARY PROBLEMS (pain or burning when urinating, or frequent urination) *BOWEL PROBLEMS (unusual diarrhea, constipation, pain near the anus) TENDERNESS IN MOUTH AND THROAT WITH OR WITHOUT PRESENCE OF ULCERS (sore throat, sores in mouth, or a toothache) UNUSUAL RASH, SWELLING OR PAIN  UNUSUAL VAGINAL DISCHARGE OR ITCHING   Items with * indicate a potential emergency and should be followed up as soon as possible or go to the Emergency Department if any problems should occur.  Please show the CHEMOTHERAPY ALERT CARD or IMMUNOTHERAPY ALERT CARD at check-in to the Emergency Department and triage nurse.  Should you have questions after your visit or need to cancel  or reschedule your appointment, please contact [facility] [phone]  and follow the prompts.  Office hours are 8:00 a.m. to 4:30 p.m. Monday - Friday. Please note that voicemails left after 4:00 p.m. may not be returned until the following business day.  We are closed weekends and major holidays. You have access to a nurse at all times for urgent questions. Please call the main number to the clinic [phone] and follow the prompts.  For any non-urgent questions, you may also contact your provider using MyChart. We now offer e-Visits for anyone 18 and older to request care online for non-urgent symptoms. For details visit [email or web address].   Also download the MyChart app! Go to the app store, search "MyChart", open the app, select [facility], and log in with your MyChart username and password.  Due to Covid, a mask is required upon entering the hospital/clinic. If you do not have a mask, one will be given to you upon arrival. For doctor visits, patients may have 1 support person aged 18 or older with them. For treatment visits, patients cannot have anyone with them due to current Covid guidelines and our immunocompromised population.  

## 2021-10-19 NOTE — Progress Notes (Signed)
Iron infusion given per orders. Patient tolerated it well without problems. Vitals stable and discharged home from clinic ambulatory. Follow up as scheduled.  

## 2021-10-19 NOTE — Progress Notes (Signed)
Chaplain engaged in an initial visit with Amy Andersen.  Chaplain learned about family, upbringing and love for reading.  She has been getting infusions for the last 3 to 4 years so she brings a book with her to read.  She loves Lavella Lemons.  Amy Andersen also expressed a love for her three daughters.  She is grateful to have them living with her and by her side.  She has been married over 20 years to her high school sweetheart.    Amy Andersen also spent some time talking about growing up with her mom, the love she held for her grandma, and moving out at 3 years old.  Chaplain offered listening, presence, and support.    10/19/21 1100  Clinical Encounter Type  Visited With Patient  Visit Type Initial

## 2021-10-19 NOTE — Progress Notes (Signed)
Patient presents today for iron infusion.  Patient is in satisfactory condition with no new complaints voiced.  Vital signs are stable.  We will proceed with treatment per MD orders.

## 2022-04-03 ENCOUNTER — Inpatient Hospital Stay (HOSPITAL_COMMUNITY): Payer: Medicaid Other | Attending: Hematology

## 2022-04-03 DIAGNOSIS — F5089 Other specified eating disorder: Secondary | ICD-10-CM | POA: Insufficient documentation

## 2022-04-03 DIAGNOSIS — D751 Secondary polycythemia: Secondary | ICD-10-CM | POA: Insufficient documentation

## 2022-04-03 DIAGNOSIS — F17210 Nicotine dependence, cigarettes, uncomplicated: Secondary | ICD-10-CM | POA: Diagnosis not present

## 2022-04-03 DIAGNOSIS — E538 Deficiency of other specified B group vitamins: Secondary | ICD-10-CM | POA: Diagnosis not present

## 2022-04-03 DIAGNOSIS — D500 Iron deficiency anemia secondary to blood loss (chronic): Secondary | ICD-10-CM

## 2022-04-03 DIAGNOSIS — D509 Iron deficiency anemia, unspecified: Secondary | ICD-10-CM | POA: Insufficient documentation

## 2022-04-03 LAB — IRON AND TIBC
Iron: 95 ug/dL (ref 28–170)
Saturation Ratios: 29 % (ref 10.4–31.8)
TIBC: 323 ug/dL (ref 250–450)
UIBC: 228 ug/dL

## 2022-04-03 LAB — CBC WITH DIFFERENTIAL/PLATELET
Abs Immature Granulocytes: 0.05 10*3/uL (ref 0.00–0.07)
Basophils Absolute: 0.1 10*3/uL (ref 0.0–0.1)
Basophils Relative: 1 %
Eosinophils Absolute: 0.1 10*3/uL (ref 0.0–0.5)
Eosinophils Relative: 1 %
HCT: 48.8 % — ABNORMAL HIGH (ref 36.0–46.0)
Hemoglobin: 16.3 g/dL — ABNORMAL HIGH (ref 12.0–15.0)
Immature Granulocytes: 0 %
Lymphocytes Relative: 39 %
Lymphs Abs: 4.5 10*3/uL — ABNORMAL HIGH (ref 0.7–4.0)
MCH: 31.9 pg (ref 26.0–34.0)
MCHC: 33.4 g/dL (ref 30.0–36.0)
MCV: 95.5 fL (ref 80.0–100.0)
Monocytes Absolute: 0.5 10*3/uL (ref 0.1–1.0)
Monocytes Relative: 5 %
Neutro Abs: 6.2 10*3/uL (ref 1.7–7.7)
Neutrophils Relative %: 54 %
Platelets: 304 10*3/uL (ref 150–400)
RBC: 5.11 MIL/uL (ref 3.87–5.11)
RDW: 12.5 % (ref 11.5–15.5)
WBC: 11.4 10*3/uL — ABNORMAL HIGH (ref 4.0–10.5)
nRBC: 0 % (ref 0.0–0.2)

## 2022-04-03 LAB — FERRITIN: Ferritin: 132 ng/mL (ref 11–307)

## 2022-04-03 LAB — VITAMIN B12: Vitamin B-12: 253 pg/mL (ref 180–914)

## 2022-04-05 ENCOUNTER — Ambulatory Visit (HOSPITAL_COMMUNITY): Payer: Medicaid Other | Admitting: Physician Assistant

## 2022-04-08 NOTE — Progress Notes (Addendum)
? ?[facility] ?[address] ?[place], [place] [address] ? ? ?CLINIC:  ?Medical Oncology/Hematology ? ?PCP:  ?Patient, No Pcp Per (Inactive) ?No address on file ?None ? ? ?REASON FOR VISIT:  ?Follow-up for iron deficiency anemia ? ?PRIOR THERAPY: Oral iron tab ? ?CURRENT THERAPY: Intermittent IV iron ? ?INTERVAL HISTORY:  ?Amy Andersen 46 y.o. female returns for routine follow-up of iron deficiency anemia.  She was last seen by Dr. Delton Coombes on 10/03/2021. ? ?At today's visit, she reports feeling fair.  No recent hospitalizations, surgeries, or changes in baseline health status. ? ?She is not currently taking any iron tablet or B12 supplements.  She reports fairly light menstrual bleeding.  She denies any hematochezia or melena.  She reports that she has chronic fatigue with energy at baseline.  She reports that she has chronic pica cravings for ice.  She denies any restless legs, headaches, chest pain, dyspnea on exertion, lightheadedness, or syncope. ? ?Regarding her erythrocytosis, she is a current smoker, 0.5 PPD.  She denies any history of COPD, obstructive sleep apnea, or carbon monoxide exposure.  She is symptomatic with occasional aquagenic pruritus and intermittent blurry vision.  She denies any erythromelalgia, Raynaud's phenomenon, or other vasomotor symptoms. ? ?She has 25% energy and 35% appetite. She endorses that she is maintaining a stable weight. ? ? ?REVIEW OF SYSTEMS:  ?Review of Systems  ?Constitutional:  Positive for appetite change and fatigue. Negative for chills, diaphoresis, fever and unexpected weight change.  ?HENT:   Negative for lump/mass and nosebleeds.   ?Eyes:  Negative for eye problems.  ?Respiratory:  Negative for cough, hemoptysis and shortness of breath.   ?Cardiovascular:  Negative for chest pain, leg swelling and palpitations.  ?Gastrointestinal:  Negative for abdominal pain, blood in stool, constipation, diarrhea, nausea and vomiting.  ?Genitourinary:  Negative for  hematuria.   ?Musculoskeletal:  Positive for arthralgias.  ?Skin: Negative.   ?Neurological:  Positive for dizziness. Negative for headaches and light-headedness.  ?Hematological:  Does not bruise/bleed easily.  ?Psychiatric/Behavioral:  Positive for sleep disturbance.    ? ? ?PAST MEDICAL/SURGICAL HISTORY:  ?Past Medical History:  ?Diagnosis Date  ? Anemia   ? Chronic pain   ? ?Past Surgical History:  ?Procedure Laterality Date  ? DILATION AND CURETTAGE OF UTERUS    ? ? ? ?SOCIAL HISTORY:  ?Social History  ? ?Socioeconomic History  ? Marital status: Married  ?  Spouse name: Cecilie Lowers  ? Number of children: 3  ? Years of education: 35  ? Highest education level: 9th grade  ?Occupational History  ? Occupation: home maker   ?Tobacco Use  ? Smoking status: Every Day  ?  Packs/day: 0.50  ?  Types: Cigarettes  ? Smokeless tobacco: Never  ?Vaping Use  ? Vaping Use: Never used  ?Substance and Sexual Activity  ? Alcohol use: No  ? Drug use: No  ? Sexual activity: Yes  ?  Birth control/protection: None  ?Other Topics Concern  ? Not on file  ?Social History Narrative  ? Not on file  ? ?Social Determinants of Health  ? ?Financial Resource Strain: Not on file  ?Food Insecurity: Not on file  ?Transportation Needs: Not on file  ?Physical Activity: Not on file  ?Stress: Not on file  ?Social Connections: Not on file  ?Intimate Partner Violence: Not on file  ? ? ?FAMILY HISTORY:  ?Family History  ?Problem Relation Age of Onset  ? Hypertension Maternal Grandmother   ? CAD Maternal Grandmother   ?  Cancer Maternal Grandfather   ? Hypertension Mother   ? ? ?CURRENT MEDICATIONS:  ?Outpatient Encounter Medications as of 04/09/2022  ?Medication Sig  ? ergocalciferol (VITAMIN D2) 1.25 MG (50000 UT) capsule Take 1 capsule (50,000 Units total) by mouth once a week.  ? famotidine (PEPCID) 40 MG tablet Take 40 mg by mouth 2 (two) times daily.  ? HYDROcodone-acetaminophen (NORCO) 10-325 MG tablet Take 1 tablet by mouth every 6 (six) hours as needed.   ? HYDROcodone-acetaminophen (NORCO) 7.5-325 MG tablet Take 1 tablet by mouth 4 (four) times daily as needed.  ? omeprazole (PRILOSEC) 40 MG capsule Take 40 mg by mouth 2 (two) times daily.  ? RESTASIS 0.05 % ophthalmic emulsion Place 1 drop into both eyes 2 (two) times daily.   ? ?No facility-administered encounter medications on file as of 04/09/2022.  ? ? ?ALLERGIES:  ?No Known Allergies ? ? ?PHYSICAL EXAM:  ?ECOG PERFORMANCE STATUS: 1 - Symptomatic but completely ambulatory ? ?There were no vitals filed for this visit. ?There were no vitals filed for this visit. ?Physical Exam ?Constitutional:   ?   Appearance: Normal appearance. She is obese.  ?HENT:  ?   Head: Normocephalic and atraumatic.  ?   Mouth/Throat:  ?   Mouth: Mucous membranes are moist.  ?Eyes:  ?   Extraocular Movements: Extraocular movements intact.  ?   Pupils: Pupils are equal, round, and reactive to light.  ?Cardiovascular:  ?   Rate and Rhythm: Regular rhythm. Tachycardia present.  ?   Pulses: Normal pulses.  ?   Heart sounds: Normal heart sounds.  ?Pulmonary:  ?   Effort: Pulmonary effort is normal.  ?   Breath sounds: Normal breath sounds.  ?Abdominal:  ?   General: Bowel sounds are normal.  ?   Palpations: Abdomen is soft.  ?   Tenderness: There is no abdominal tenderness.  ?Musculoskeletal:     ?   General: No swelling.  ?   Right lower leg: No edema.  ?   Left lower leg: No edema.  ?Lymphadenopathy:  ?   Cervical: No cervical adenopathy.  ?Skin: ?   General: Skin is warm and dry.  ?Neurological:  ?   General: No focal deficit present.  ?   Mental Status: She is alert and oriented to person, place, and time.  ?Psychiatric:     ?   Mood and Affect: Mood normal.     ?   Behavior: Behavior normal.  ? ? ? ?LABORATORY DATA:  ?I have reviewed the labs as listed.  ?CBC ?   ?Component Value Date/Time  ? WBC 11.4 (H) 04/03/2022 0826  ? RBC 5.11 04/03/2022 0826  ? HGB 16.3 (H) 04/03/2022 0826  ? HCT 48.8 (H) 04/03/2022 0826  ? PLT 304 04/03/2022 0826   ? MCV 95.5 04/03/2022 0826  ? MCH 31.9 04/03/2022 0826  ? MCHC 33.4 04/03/2022 0826  ? RDW 12.5 04/03/2022 0826  ? LYMPHSABS 4.5 (H) 04/03/2022 [id number]  ? MONOABS 0.5 04/03/2022 0826  ? EOSABS 0.1 04/03/2022 0826  ? BASOSABS 0.1 04/03/2022 0826  ? ? ?  Latest Ref Rng & Units 09/20/2020  ? 10:54 AM 05/18/2020  ?  8:11 AM 02/10/2020  ? 10:52 AM  ?CMP  ?Glucose 70 - 99 mg/dL 102   116   110    ?BUN 6 - 20 mg/dL 9   12   9     ?Creatinine 0.44 - 1.00 mg/dL 0.75   0.72   0.75    ?  Sodium 135 - 145 mmol/L 138   136   137    ?Potassium 3.5 - 5.1 mmol/L 4.2   3.8   3.3    ?Chloride 98 - 111 mmol/L 103   101   101    ?CO2 22 - 32 mmol/L 29   27   27     ?Calcium 8.9 - 10.3 mg/dL 9.5   9.4   9.1    ?Total Protein 6.5 - 8.1 g/dL 7.7   7.3   7.7    ?Total Bilirubin 0.3 - 1.2 mg/dL 0.6   0.5   0.5    ?Alkaline Phos 38 - 126 U/L 55   61   69    ?AST 15 - 41 U/L 14   28   18     ?ALT 0 - 44 U/L 14   43   18    ? ? ?DIAGNOSTIC IMAGING:  ?I have independently reviewed the relevant imaging and discussed with the patient. ? ?ASSESSMENT & PLAN: ?1.  Severe iron deficiency anemia ?- She was found to be severely anemic with a hemoglobin of 6.8 in March 2019.  She was started on oral iron therapy.  Her hemoglobin on 03/27/2018 improved to 8.9. ?- She is actively menstruating, with 7/28 days.  She reports fairly light blood flow.  Denies any rectal bleeding, melena, epistaxis, or hematuria. ?- Unable to tolerate iron tablet due to gastric upset. ?- Most recent IV iron with Venofer 1000 mg from 10/05/2021 through 10/19/2021 ?-She reports chronic fatigue and chronic ice pica. ?- Most recent labs (04/03/2022): Hgb 16.3/MCV 95.5, ferritin 132, iron saturation 29% ?- PLAN: No indication for IV iron.  Repeat labs and RTC with phone visit in 6 months. ? ?2.   Vitamin B12 deficiency: ?- She had a history of B12 deficiency and had previously been on B12 injections. ?- She is stopped taking vitamin B12 1000 mcg tablet for unknown reasons ?- Labs (04/03/2022)  showed normal B12 at 253, methylmalonic acid not checked ?- PLAN: Recommend restarting daily vitamin B12 supplement.  Recheck vitamin B12 and methylmalonic acid at follow-up in 6 months. ? ?3.  Erythrocyto

## 2022-04-09 ENCOUNTER — Inpatient Hospital Stay (HOSPITAL_COMMUNITY): Payer: Medicaid Other

## 2022-04-09 ENCOUNTER — Inpatient Hospital Stay (HOSPITAL_BASED_OUTPATIENT_CLINIC_OR_DEPARTMENT_OTHER): Payer: Medicaid Other | Admitting: Physician Assistant

## 2022-04-09 VITALS — BP 148/93 | HR 103 | Temp 98.2°F | Resp 16 | Ht 67.0 in | Wt 202.6 lb

## 2022-04-09 DIAGNOSIS — D500 Iron deficiency anemia secondary to blood loss (chronic): Secondary | ICD-10-CM

## 2022-04-09 DIAGNOSIS — E538 Deficiency of other specified B group vitamins: Secondary | ICD-10-CM

## 2022-04-09 DIAGNOSIS — E559 Vitamin D deficiency, unspecified: Secondary | ICD-10-CM

## 2022-04-09 DIAGNOSIS — D751 Secondary polycythemia: Secondary | ICD-10-CM | POA: Diagnosis not present

## 2022-04-09 DIAGNOSIS — D509 Iron deficiency anemia, unspecified: Secondary | ICD-10-CM | POA: Diagnosis not present

## 2022-04-09 NOTE — Patient Instructions (Signed)
[place] [facility] ?Discharge Instructions ? ?You were seen today by Tarri Abernethy PA-C for your iron deficiency anemia. ? ?IRON DEFICIENCY ANEMIA: ?You do not have any low iron or low blood at this appointment. ?You do not need any IV iron at this time. ?We will recheck your labs in 6 months. ? ?VITAMIN B-12 DEFICIENCY: ?You should START taking over-the-counter vitamin B12 supplement (cyanocobalamin) 1000 mcg daily. ?We will recheck your labs in 6 months. ? ?ELEVATED RED BLOOD CELLS: ?Although you had extremely low blood cells in the past, your blood cells have been elevated lately. ?This may be related to your smoking, but we will check additional labs TODAY to make sure you do not have any other causes of elevated blood cells. ?We will call you with these results when they are available (in about 1 to 2 weeks). ? ?FOLLOW-UP APPOINTMENT: Labs and office visit in 6 months ? ? ?Thank you for choosing [place] [facility] to provide your oncology and hematology care.  To afford each patient quality time with our provider, please arrive at least 15 minutes before your scheduled appointment time.  ? ?If you have a lab appointment with the [facility] please come in thru the Main Entrance and check in at the main information desk. ? ?You need to re-schedule your appointment should you arrive 10 or more minutes late.  We strive to give you quality time with our providers, and arriving late affects you and other patients whose appointments are after yours.  Also, if you no show three or more times for appointments you may be dismissed from the clinic at the providers discretion.     ?Again, thank you for choosing [facility].  Our hope is that these requests will decrease the amount of time that you wait before being seen by our physicians.       ?_____________________________________________________________ ? ?Should you have questions after  your visit to [facility], please contact our office at [phone] and follow the prompts.  Our office hours are 8:00 a.m. and 4:30 p.m. Monday - Friday.  Please note that voicemails left after 4:00 p.m. may not be returned until the following business day.  We are closed weekends and major holidays.  You do have access to a nurse 24-7, just call the main number to the clinic [phone] and do not press any options, hold on the line and a nurse will answer the phone.   ? ?For prescription refill requests, have your pharmacy contact our office and allow 72 hours.   ? ?Due to Covid, you will need to wear a mask upon entering the hospital. If you do not have a mask, a mask will be given to you at the Main Entrance upon arrival. For doctor visits, patients may have 1 support person age 92 or older with them. For treatment visits, patients can not have anyone with them due to social distancing guidelines and our immunocompromised population.  ? ? ? ?

## 2022-04-10 LAB — ERYTHROPOIETIN: Erythropoietin: 9 m[IU]/mL (ref 2.6–18.5)

## 2022-04-11 LAB — CARBON MONOXIDE, BLOOD (PERFORMED AT REF LAB): Carbon Monoxide, Blood: 8.7 % — ABNORMAL HIGH (ref 0.0–3.6)

## 2022-04-16 LAB — JAK2 V617F RFX CALR/MPL/E12-15

## 2022-04-16 LAB — CALR +MPL + E12-E15  (REFLEX)

## 2022-10-09 ENCOUNTER — Inpatient Hospital Stay: Payer: Medicaid Other | Attending: Hematology

## 2022-10-09 DIAGNOSIS — E559 Vitamin D deficiency, unspecified: Secondary | ICD-10-CM | POA: Diagnosis not present

## 2022-10-09 DIAGNOSIS — D751 Secondary polycythemia: Secondary | ICD-10-CM | POA: Insufficient documentation

## 2022-10-09 DIAGNOSIS — D509 Iron deficiency anemia, unspecified: Secondary | ICD-10-CM | POA: Diagnosis not present

## 2022-10-09 DIAGNOSIS — D500 Iron deficiency anemia secondary to blood loss (chronic): Secondary | ICD-10-CM

## 2022-10-09 DIAGNOSIS — E538 Deficiency of other specified B group vitamins: Secondary | ICD-10-CM | POA: Diagnosis not present

## 2022-10-09 LAB — CBC WITH DIFFERENTIAL/PLATELET
Abs Immature Granulocytes: 0.02 10*3/uL (ref 0.00–0.07)
Basophils Absolute: 0.1 10*3/uL (ref 0.0–0.1)
Basophils Relative: 1 %
Eosinophils Absolute: 0.2 10*3/uL (ref 0.0–0.5)
Eosinophils Relative: 3 %
HCT: 49.8 % — ABNORMAL HIGH (ref 36.0–46.0)
Hemoglobin: 16.7 g/dL — ABNORMAL HIGH (ref 12.0–15.0)
Immature Granulocytes: 0 %
Lymphocytes Relative: 33 %
Lymphs Abs: 2.5 10*3/uL (ref 0.7–4.0)
MCH: 31.5 pg (ref 26.0–34.0)
MCHC: 33.5 g/dL (ref 30.0–36.0)
MCV: 94 fL (ref 80.0–100.0)
Monocytes Absolute: 0.4 10*3/uL (ref 0.1–1.0)
Monocytes Relative: 6 %
Neutro Abs: 4.2 10*3/uL (ref 1.7–7.7)
Neutrophils Relative %: 57 %
Platelets: 339 10*3/uL (ref 150–400)
RBC: 5.3 MIL/uL — ABNORMAL HIGH (ref 3.87–5.11)
RDW: 12.5 % (ref 11.5–15.5)
WBC: 7.4 10*3/uL (ref 4.0–10.5)
nRBC: 0 % (ref 0.0–0.2)

## 2022-10-09 LAB — VITAMIN B12: Vitamin B-12: 212 pg/mL (ref 180–914)

## 2022-10-09 LAB — VITAMIN D 25 HYDROXY (VIT D DEFICIENCY, FRACTURES): Vit D, 25-Hydroxy: 10.69 ng/mL — ABNORMAL LOW (ref 30–100)

## 2022-10-09 LAB — IRON AND TIBC
Iron: 111 ug/dL (ref 28–170)
Saturation Ratios: 30 % (ref 10.4–31.8)
TIBC: 364 ug/dL (ref 250–450)
UIBC: 253 ug/dL

## 2022-10-09 LAB — FERRITIN: Ferritin: 137 ng/mL (ref 11–307)

## 2022-10-11 LAB — METHYLMALONIC ACID, SERUM: Methylmalonic Acid, Quantitative: 179 nmol/L (ref 0–378)

## 2022-10-15 NOTE — Progress Notes (Unsigned)
[address] [place], [place] [address]   CLINIC:  Medical Oncology/Hematology  PCP:  Patient, No Pcp Per No address on file None   REASON FOR VISIT:  Follow-up for iron deficiency anemia  PRIOR THERAPY: Oral iron tab  CURRENT THERAPY: Intermittent IV iron  INTERVAL HISTORY:  Amy Andersen 46 y.o. female returns for routine follow-up of iron deficiency anemia.  She was last seen by Tarri Abernethy PA-C on 04/09/2022.  At today's visit, she reports feeling fair.  No recent hospitalizations, surgeries, or changes in baseline health status.  She is taking daily B12 supplement at home.  She does not take any iron supplements due to gastric upset.  She reports fairly light menstrual bleeding, which is irregular.  She denies any hematochezia or melena.   She reports that she has chronic fatigue with energy at baseline.  She reports that she has chronic pica cravings for ice.  She denies any restless legs, chest pain, dyspnea on exertion, lightheadedness, or syncope.  Regarding her erythrocytosis, she is a current smoker, 0.5 PPD.  She has occasional aquagenic pruritus and intermittent blurry vision.  She reports headaches and intermittent dizziness that is not associated with position changes.  She denies any erythromelalgia, Raynaud's phenomenon, or other vasomotor symptoms.  She has 25% energy and 50% appetite. She endorses that she is maintaining a stable weight.   REVIEW OF SYSTEMS:  Review of Systems  Constitutional:  Positive for appetite change and fatigue. Negative for chills, diaphoresis, fever and unexpected weight change.  HENT:   Negative for lump/mass and nosebleeds.   Eyes:  Positive for eye problems (Intermittent blurry vision).  Respiratory:  Negative for cough, hemoptysis and shortness of breath.   Cardiovascular:  Negative for chest pain, leg swelling and palpitations.  Gastrointestinal:  Negative for abdominal pain, blood in stool,  constipation, diarrhea, nausea and vomiting.  Genitourinary:  Negative for hematuria.   Musculoskeletal:  Positive for arthralgias.  Skin:  Positive for itching (Aquagenic pruritus).  Neurological:  Positive for dizziness and headaches. Negative for light-headedness.  Hematological:  Does not bruise/bleed easily.  Psychiatric/Behavioral:  Positive for sleep disturbance.       PAST MEDICAL/SURGICAL HISTORY:  Past Medical History:  Diagnosis Date   Anemia    Chronic pain    Past Surgical History:  Procedure Laterality Date   DILATION AND CURETTAGE OF UTERUS       SOCIAL HISTORY:  Social History   Socioeconomic History   Marital status: Married    Spouse name: Cecilie Lowers   Number of children: 3   Years of education: 9   Highest education level: 9th grade  Occupational History   Occupation: Materials engineer   Tobacco Use   Smoking status: Every Day    Packs/day: 0.50    Types: Cigarettes   Smokeless tobacco: Never  Vaping Use   Vaping Use: Never used  Substance and Sexual Activity   Alcohol use: No   Drug use: No   Sexual activity: Yes    Birth control/protection: None  Other Topics Concern   Not on file  Social History Narrative   Not on file   Social Determinants of Health   Financial Resource Strain: Low Risk  (04/10/2020)   Overall Financial Resource Strain (CARDIA)    Difficulty of Paying Living Expenses: Not very hard  Food Insecurity: No Food Insecurity (04/10/2020)   Hunger Vital Sign    Worried About Running Out of Food in the Last  Year: Never true    Ran Out of Food in the Last Year: Never true  Transportation Needs: No Transportation Needs (04/10/2020)   PRAPARE - Administrator, Civil Service (Medical): No    Lack of Transportation (Non-Medical): No  Physical Activity: Insufficiently Active (04/10/2020)   Exercise Vital Sign    Days of Exercise per Week: 4 days    Minutes of Exercise per Session: 30 min  Stress: No Stress Concern Present  (04/10/2020)   [employer] of Occupational Health - Occupational Stress Questionnaire    Feeling of Stress : Not at all  Social Connections: Moderately Integrated (04/10/2020)   Social Connection and Isolation Panel [NHANES]    Frequency of Communication with Friends and Family: Three times a week    Frequency of Social Gatherings with Friends and Family: Once a week    Attends Religious Services: 1 to 4 times per year    Active Member of [employer] or Organizations: No    Attends Banker Meetings: Never    Marital Status: Married  Catering manager Violence: Not At Risk (04/10/2020)   Humiliation, Afraid, Rape, and Kick questionnaire    Fear of Current or Ex-Partner: No    Emotionally Abused: No    Physically Abused: No    Sexually Abused: No    FAMILY HISTORY:  Family History  Problem Relation Age of Onset   Hypertension Maternal Grandmother    CAD Maternal Grandmother    Cancer Maternal Grandfather    Hypertension Mother     CURRENT MEDICATIONS:  Outpatient Encounter Medications as of 10/16/2022  Medication Sig   ergocalciferol (VITAMIN D2) 1.25 MG (50000 UT) capsule Take 1 capsule (50,000 Units total) by mouth once a week.   famotidine (PEPCID) 40 MG tablet Take 40 mg by mouth 2 (two) times daily.   HYDROcodone-acetaminophen (NORCO) 10-325 MG tablet Take 1 tablet by mouth every 6 (six) hours as needed.   meloxicam (MOBIC) 15 MG tablet Take 15 mg by mouth daily.   omeprazole (PRILOSEC) 40 MG capsule Take 40 mg by mouth 2 (two) times daily.   RESTASIS 0.05 % ophthalmic emulsion Place 1 drop into both eyes 2 (two) times daily.    No facility-administered encounter medications on file as of 10/16/2022.    ALLERGIES:  No Known Allergies   PHYSICAL EXAM:  ECOG PERFORMANCE STATUS: 1 - Symptomatic but completely ambulatory  There were no vitals filed for this visit. There were no vitals filed for this visit. Physical Exam Constitutional:      Appearance: Normal  appearance. She is obese.  HENT:     Head: Normocephalic and atraumatic.     Mouth/Throat:     Mouth: Mucous membranes are moist.  Eyes:     Extraocular Movements: Extraocular movements intact.     Pupils: Pupils are equal, round, and reactive to light.  Cardiovascular:     Rate and Rhythm: Regular rhythm. Tachycardia present.     Pulses: Normal pulses.     Heart sounds: Normal heart sounds.  Pulmonary:     Effort: Pulmonary effort is normal.     Breath sounds: Normal breath sounds.  Abdominal:     General: Bowel sounds are normal.     Palpations: Abdomen is soft.     Tenderness: There is no abdominal tenderness.  Musculoskeletal:        General: No swelling.     Right lower leg: No edema.     Left lower leg: No  edema.  Lymphadenopathy:     Cervical: No cervical adenopathy.  Skin:    General: Skin is warm and dry.  Neurological:     General: No focal deficit present.     Mental Status: She is alert and oriented to person, place, and time.  Psychiatric:        Mood and Affect: Mood normal.        Behavior: Behavior normal.     LABORATORY DATA:  I have reviewed the labs as listed.  CBC    Component Value Date/Time   WBC 7.4 10/09/2022 0856   RBC 5.30 (H) 10/09/2022 0856   HGB 16.7 (H) 10/09/2022 0856   HCT 49.8 (H) 10/09/2022 0856   PLT 339 10/09/2022 0856   MCV 94.0 10/09/2022 0856   MCH 31.5 10/09/2022 0856   MCHC 33.5 10/09/2022 0856   RDW 12.5 10/09/2022 0856   LYMPHSABS 2.5 10/09/2022 0856   MONOABS 0.4 10/09/2022 0856   EOSABS 0.2 10/09/2022 0856   BASOSABS 0.1 10/09/2022 0856      Latest Ref Rng & Units 09/20/2020   10:54 AM 05/18/2020    8:11 AM 02/10/2020   10:52 AM  CMP  Glucose 70 - 99 mg/dL 440  347  425   BUN 6 - 20 mg/dL 9  12  9    Creatinine 0.44 - 1.00 mg/dL  9.56  3.87   Sodium 135 - 145 mmol/L 138  136  137   Potassium 3.5 - 5.1 mmol/L 4.2  3.8  3.3   Chloride 98 - 111 mmol/L 103  101  101   CO2 22 - 32 mmol/L 29  27  27    Calcium  8.9 - 10.3 mg/dL 9.5  9.4  9.1   Total Protein 6.5 - 8.1 g/dL 7.7  7.3  7.7   Total Bilirubin 0.3 - 1.2 mg/dL 0.6  0.5  0.5   Alkaline Phos 38 - 126 U/L 55  61  69   AST 15 - 41 U/L 14  28  18    ALT 0 - 44 U/L 14  43  18     DIAGNOSTIC IMAGING:  I have independently reviewed the relevant imaging and discussed with the patient.  ASSESSMENT & PLAN: 1.  Severe iron deficiency anemia - She was found to be severely anemic with a hemoglobin of 6.8 in March 2019.  She was started on oral iron therapy.  Her hemoglobin on 03/27/2018 improved to 8.9. - She reports that her periods are more irregular lately. She reports fairly light blood flow.  Denies any rectal bleeding, melena, epistaxis, or hematuria.  - Unable to tolerate iron tablet due to gastric upset. - Most recent IV iron with Venofer 1000 mg from 10/05/2021 through 10/19/2021 - She reports chronic fatigue and chronic ice pica.  - Most recent labs (04/03/2022): Hgb 16.7/MCV 94.0, ferritin 137, iron saturation 30% - PLAN: No indication for IV iron.  Repeat labs and RTC with phone visit in 6 months.  2.   Vitamin B12 deficiency: - She had a history of B12 deficiency and had previously been on B12 injections. - She is stopped taking vitamin B12 1000 mcg tablet for unknown reasons, and did not yet restarted her B12 tablet as recommended at last appointment - Labs (10/09/2022) showed low-normal B12 at 212, normal MMA 179 - PLAN: Recommend restarting 500 mcg daily vitamin B12 supplement.  Recheck vitamin B12 and methylmalonic acid at follow-up in 6 months.  3.  Erythrocytosis -  Although previously had severe anemia from iron deficiency, patient has had erythrocytosis since October 2021 - She is a current smoker, 0.5 PPD. - She denies any history of COPD, obstructive sleep apnea, or carbon monoxide exposure. - JAK2 with reflex to CALR, MPL, and E12-E15 was negative. - Elevated carbon monoxide 8.7% within range for smokers. - Normal  erythropoietin 9.0. - She is symptomatic with occasional aquagenic pruritus, headaches, dizziness, and intermittent blurry vision.  She denies any erythromelalgia, Raynaud's phenomenon, or other vasomotor symptoms. - Most recent labs (10/09/2022): Hgb 16.7/HCT 49.8, normal WBC and differential - She does not have any other cardiac risk factors such as hypertension, hyperlipidemia, or diabetes.  She does not take any aspirin at home. - PLAN: Erythrocytosis likely secondary to cigarette smoking.  Primary treatment aimed at smoking cessation. - No indication for phlebotomy at this time.  Discussed that phlebotomy would be indicated in the setting of HCT >54.0 for severe vasomotor symptoms. - No indication for aspirin in the absence of any other cardiac risk factors. -- We discussed smoking cessation extensively at today's visit.  4.  Vitamin D deficiency - Most recent vitamin D (10/09/2022) significantly low at 10.69 - Patient reports that she stopped taking her vitamin D supplement - PLAN: Prescription sent to pharmacy for vitamin D 50,000 units weekly.  We will recheck levels in 6 months.   PLAN SUMMARY & DISPOSITION: Labs in 6 months RTC 1 week after 54-month labs with PHONE visit  All questions were answered. The patient knows to call the clinic with any problems, questions or concerns.  Medical decision making: Moderate  Time spent on visit: I spent 20 minutes counseling the patient face to face. The total time spent in the appointment was 30 minutes and more than 50% was on counseling.   Carnella Guadalajara, PA-C  10/16/2022 8:54 AM

## 2022-10-16 ENCOUNTER — Encounter: Payer: Self-pay | Admitting: Physician Assistant

## 2022-10-16 ENCOUNTER — Inpatient Hospital Stay (HOSPITAL_COMMUNITY): Payer: Medicaid Other | Attending: Physician Assistant | Admitting: Physician Assistant

## 2022-10-16 VITALS — HR 98 | Wt 199.0 lb

## 2022-10-16 DIAGNOSIS — D500 Iron deficiency anemia secondary to blood loss (chronic): Secondary | ICD-10-CM | POA: Diagnosis not present

## 2022-10-16 DIAGNOSIS — F5089 Other specified eating disorder: Secondary | ICD-10-CM | POA: Insufficient documentation

## 2022-10-16 DIAGNOSIS — D509 Iron deficiency anemia, unspecified: Secondary | ICD-10-CM | POA: Diagnosis not present

## 2022-10-16 DIAGNOSIS — E559 Vitamin D deficiency, unspecified: Secondary | ICD-10-CM | POA: Diagnosis not present

## 2022-10-16 DIAGNOSIS — F17210 Nicotine dependence, cigarettes, uncomplicated: Secondary | ICD-10-CM | POA: Insufficient documentation

## 2022-10-16 DIAGNOSIS — E538 Deficiency of other specified B group vitamins: Secondary | ICD-10-CM | POA: Diagnosis not present

## 2022-10-16 DIAGNOSIS — D751 Secondary polycythemia: Secondary | ICD-10-CM | POA: Diagnosis not present

## 2022-10-16 MED ORDER — VITAMIN D (ERGOCALCIFEROL) 1.25 MG (50000 UNIT) PO CAPS: 50000.0000 [IU] | ORAL_CAPSULE | ORAL | 7 refills | Status: DC

## 2022-10-16 NOTE — Patient Instructions (Signed)
[facility] at [facility] Discharge Instructions  You were seen today by Rojelio Brenner PA-C for your iron deficiency anemia.  IRON DEFICIENCY ANEMIA: You do not have any low iron or low blood at this appointment. You do not need any IV iron at this time. We will recheck your labs in 6 months.  VITAMIN B-12 DEFICIENCY: You should START taking over-the-counter vitamin B12 supplement (cyanocobalamin) 500 mcg daily. We will recheck your labs in 6 months.  ELEVATED RED BLOOD CELLS: Although you had extremely low blood cells in the past, your blood cells have been elevated lately. We checked lab test to make sure you do not have any genetic mutations or blood cancer causing your high red blood cells, and these test came back as normal. Your elevated red blood cells are related to your tobacco use/smoking. Having elevated red blood cells places you at increased risk of blood clots, heart attack, and stroke. The main treatment for your elevated red blood cell would be for you to quit smoking. You may also find that donating blood once every 2 to 3 months will decrease your symptoms of blurry vision, headaches, dizziness, etc. We will recheck your labs and discuss again in 6 months.  TOBACCO USE: For the sake of your health, I strongly encourage you to quit smoking again.  Please see the attached handout for tips and tricks to help with this.  You can also go online and look up "[facility] Quit Smart."  This will take you to a webpage with information about free classes on smoking cessation offered by [facility].  FOLLOW-UP APPOINTMENT: Labs in 6 months followed by phone visit  ** Thank you for trusting me with your healthcare!  I strive to provide all of my patients with quality care at each visit.  If you receive a survey for this visit, I would be so grateful to you for taking the time to provide feedback.  Thank you in advance!  ~ Toriana Sponsel                   Dr.  Doreatha Massed   &   Rojelio Brenner, PA-C   - - - - - - - - - - - - - - - - - -     Thank you for choosing [facility] at [facility] to provide your oncology and hematology care.  To afford each patient quality time with our provider, please arrive at least 15 minutes before your scheduled appointment time.   If you have a lab appointment with the Cancer Center please come in thru the Main Entrance and check in at the main information desk.  You need to re-schedule your appointment should you arrive 10 or more minutes late.  We strive to give you quality time with our providers, and arriving late affects you and other patients whose appointments are after yours.  Also, if you no show three or more times for appointments you may be dismissed from the clinic at the providers discretion.     Again, thank you for choosing [facility].  Our hope is that these requests will decrease the amount of time that you wait before being seen by our physicians.       _____________________________________________________________  Should you have questions after your visit to [facility], please contact our office at [phone] and follow the prompts.  Our office hours are 8:00 a.m.  and 4:30 p.m. Monday - Friday.  Please note that voicemails left after 4:00 p.m. may not be returned until the following business day.  We are closed weekends and major holidays.  You do have access to a nurse 24-7, just call the main number to the clinic [phone] and do not press any options, hold on the line and a nurse will answer the phone.    For prescription refill requests, have your pharmacy contact our office and allow 72 hours.    Due to Covid, you will need to wear a mask upon entering the hospital. If you do not have a mask, a mask will be given to you at the Main Entrance upon arrival. For doctor visits, patients may have 1 support person age 24 or older with  them. For treatment visits, patients can not have anyone with them due to social distancing guidelines and our immunocompromised population.

## 2022-12-13 ENCOUNTER — Other Ambulatory Visit (HOSPITAL_COMMUNITY): Payer: Self-pay | Admitting: Family Medicine

## 2022-12-13 DIAGNOSIS — R1032 Left lower quadrant pain: Secondary | ICD-10-CM

## 2022-12-13 DIAGNOSIS — R102 Pelvic and perineal pain: Secondary | ICD-10-CM

## 2022-12-24 ENCOUNTER — Ambulatory Visit (HOSPITAL_COMMUNITY)
Admission: RE | Admit: 2022-12-24 | Discharge: 2022-12-24 | Disposition: A | Discharge status: Home / Self Care | Payer: Medicaid Other | Source: Ambulatory Visit | Attending: Family Medicine | Admitting: Family Medicine

## 2022-12-24 DIAGNOSIS — R102 Pelvic and perineal pain: Secondary | ICD-10-CM

## 2022-12-24 DIAGNOSIS — R1032 Left lower quadrant pain: Secondary | ICD-10-CM | POA: Diagnosis not present

## 2023-01-23 ENCOUNTER — Ambulatory Visit: Payer: Medicaid Other | Admitting: Adult Health

## 2023-03-11 DIAGNOSIS — M170 Bilateral primary osteoarthritis of knee: Secondary | ICD-10-CM | POA: Insufficient documentation

## 2023-04-16 ENCOUNTER — Inpatient Hospital Stay: Payer: Medicaid Other | Attending: Physician Assistant

## 2023-04-16 ENCOUNTER — Other Ambulatory Visit: Payer: Medicaid Other

## 2023-04-16 DIAGNOSIS — E559 Vitamin D deficiency, unspecified: Secondary | ICD-10-CM | POA: Diagnosis not present

## 2023-04-16 DIAGNOSIS — F17210 Nicotine dependence, cigarettes, uncomplicated: Secondary | ICD-10-CM | POA: Diagnosis not present

## 2023-04-16 DIAGNOSIS — D509 Iron deficiency anemia, unspecified: Secondary | ICD-10-CM | POA: Diagnosis not present

## 2023-04-16 DIAGNOSIS — D751 Secondary polycythemia: Secondary | ICD-10-CM

## 2023-04-16 DIAGNOSIS — D500 Iron deficiency anemia secondary to blood loss (chronic): Secondary | ICD-10-CM

## 2023-04-16 DIAGNOSIS — E538 Deficiency of other specified B group vitamins: Secondary | ICD-10-CM | POA: Diagnosis present

## 2023-04-16 LAB — IRON AND TIBC
Iron: 109 ug/dL (ref 28–170)
Saturation Ratios: 30 % (ref 10.4–31.8)
TIBC: 363 ug/dL (ref 250–450)
UIBC: 254 ug/dL

## 2023-04-16 LAB — CBC WITH DIFFERENTIAL/PLATELET
Abs Immature Granulocytes: 0.01 10*3/uL (ref 0.00–0.07)
Basophils Absolute: 0.1 10*3/uL (ref 0.0–0.1)
Basophils Relative: 1 %
Eosinophils Absolute: 0.2 10*3/uL (ref 0.0–0.5)
Eosinophils Relative: 3 %
HCT: 48.5 % — ABNORMAL HIGH (ref 36.0–46.0)
Hemoglobin: 16 g/dL — ABNORMAL HIGH (ref 12.0–15.0)
Immature Granulocytes: 0 %
Lymphocytes Relative: 42 %
Lymphs Abs: 3.2 10*3/uL (ref 0.7–4.0)
MCH: 31.9 pg (ref 26.0–34.0)
MCHC: 33 g/dL (ref 30.0–36.0)
MCV: 96.8 fL (ref 80.0–100.0)
Monocytes Absolute: 0.3 10*3/uL (ref 0.1–1.0)
Monocytes Relative: 4 %
Neutro Abs: 3.7 10*3/uL (ref 1.7–7.7)
Neutrophils Relative %: 50 %
Platelets: 291 10*3/uL (ref 150–400)
RBC: 5.01 MIL/uL (ref 3.87–5.11)
RDW: 12.5 % (ref 11.5–15.5)
WBC: 7.5 10*3/uL (ref 4.0–10.5)
nRBC: 0 % (ref 0.0–0.2)

## 2023-04-16 LAB — VITAMIN B12: Vitamin B-12: 170 pg/mL — ABNORMAL LOW (ref 180–914)

## 2023-04-16 LAB — FERRITIN: Ferritin: 110 ng/mL (ref 11–307)

## 2023-04-16 LAB — VITAMIN D 25 HYDROXY (VIT D DEFICIENCY, FRACTURES): Vit D, 25-Hydroxy: 55.15 ng/mL (ref 30–100)

## 2023-04-19 LAB — METHYLMALONIC ACID, SERUM: Methylmalonic Acid, Quantitative: 332 nmol/L (ref 0–378)

## 2023-04-24 NOTE — Progress Notes (Signed)
VIRTUAL VISIT via TELEPHONE NOTE [facility]   I connected with Amy Andersen  on 04/25/23 at 1:00 PM by telephone and verified that I am speaking with the correct person using two identifiers.  Location: Patient: Home Provider: [facility]   I discussed the limitations, risks, security and privacy concerns of performing an evaluation and management service by telephone and the availability of in person appointments. I also discussed with the patient that there may be a patient responsible charge related to this service. The patient expressed understanding and agreed to proceed.  REASON FOR VISIT:  Follow-up for iron deficiency anemia   PRIOR THERAPY: Oral iron tab   CURRENT THERAPY: Intermittent IV iron  INTERVAL HISTORY:  Amy Andersen is contacted today for follow-up of iron deficiency anemia, erythrocytosis, B12 deficiency, and vitamin D deficiency..  She was last seen by Rojelio Brenner PA-C on 10/16/2022.   At today's visit, she reports feeling increasingly fatigued.  She reports taking her vitamin B12 daily as well as vitamin D 50,000 units weekly.  She reports fairly light and irregular menstrual bleeding.  She denies any hematochezia or melena.   She reports that she has chronic pica cravings for ice. She has some lightheadedness. She denies any restless legs, chest pain, dyspnea on exertion, and syncope.  Regarding her erythrocytosis, she is a current smoker, 0.5 PPD.   She has occasional aquagenic pruritus and intermittent blurry vision.   She reports headaches and intermittent dizziness that is not associated with position changes.   She denies any erythromelalgia, Raynaud's phenomenon, or other vasomotor symptoms.   She has 25% energy and 70% appetite. She endorses that she is maintaining a stable weight.   REVIEW OF SYSTEMS:   Review of Systems  Constitutional:  Positive for malaise/fatigue. Negative for chills, diaphoresis,  fever and weight loss.  Respiratory:  Negative for cough and shortness of breath.   Cardiovascular:  Negative for chest pain and palpitations.  Gastrointestinal:  Negative for abdominal pain, blood in stool, melena, nausea and vomiting.  Musculoskeletal:  Positive for joint pain.  Neurological:  Negative for dizziness and headaches.     PHYSICAL EXAM: (per limitations of virtual telephone visit)  The patient is alert and oriented x 3, exhibiting adequate mentation, good mood, and ability to speak in full sentences and execute sound judgement.  ASSESSMENT & PLAN:  1.  Severe iron deficiency anemia - She was found to be severely anemic with a hemoglobin of 6.8 in March 2019.  She was started on oral iron therapy, but unable to tolerate this due to digestive upset - She reports that her periods are more irregular lately. She reports fairly light blood flow.  Denies any rectal bleeding, melena, epistaxis, or hematuria.  - Most recent IV iron with Venofer 1000 mg from 10/05/2021 through 10/19/2021 - She reports chronic fatigue and chronic ice pica.  - Most recent labs (04/16/2023): Hgb 16.0/MCV 96.8, ferritin 110, iron saturation 30% - PLAN: No indication for IV iron.  Repeat labs and RTC with phone visit in 6 months.   2.  Erythrocytosis - Although previously had severe anemia from iron deficiency, patient has had erythrocytosis since October 2021 - She is a current smoker, 0.5 PPD. - She denies any history of COPD, obstructive sleep apnea, or carbon monoxide exposure. - JAK2 with reflex to CALR, MPL, and E12-E15 was negative. - Elevated carbon monoxide 8.7% within range for smokers.  Normal erythropoietin 9.0. -  She is symptomatic with occasional aquagenic pruritus, headaches, dizziness, and intermittent blurry vision.  She denies any erythromelalgia, Raynaud's phenomenon, or other vasomotor symptoms. - Most recent labs (04/16/2023): Hgb 16.0/HCT 48.5, normal WBC and differential - She does not  have any other cardiac risk factors such as hypertension, hyperlipidemia, or diabetes.  She does not take any aspirin at home. - PLAN: Erythrocytosis likely secondary to cigarette smoking.  Primary treatment aimed at smoking cessation. - No indication for phlebotomy at this time.  Discussed that phlebotomy would be indicated in the setting of HCT >54.0 for severe vasomotor symptoms. - No indication for aspirin in the absence of any other cardiac risk factors. -- We discussed smoking cessation extensively at today's visit.   3.   Vitamin B12 deficiency: - She had a history of B12 deficiency and had previously been on B12 injections. - She reports that she has been taking vitamin B12 1000 mcg daily for the past 6 months - Labs (04/16/2023) show worsening vitamin B12 170 with MMA trending upwards at 332 - PLAN: We will switch her to vitamin B12 injections.  Recheck vitamin B12 and methylmalonic acid at follow-up in 6 months.     4.  Vitamin D deficiency - Vitamin D (10/09/2022) was significantly low at 10.69 - Patient started on vitamin D 50,000 units weekly in November 2023 - Most recent vitamin D (04/16/2023): Normal at 55.15 - PLAN: We will decrease vitamin D to 50,000 units every 2 weeks.  We will recheck levels in 6 months.  PLAN SUMMARY: >> Vitamin B12 injections (weekly x 4, then monthly) >> Labs in 6 months = CBC/D, ferritin, iron/TIBC, B12, MMA, vitamin D >> PHONE visit in 6 months (1 week after labs)     I discussed the assessment and treatment plan with the patient. The patient was provided an opportunity to ask questions and all were answered. The patient agreed with the plan and demonstrated an understanding of the instructions.   The patient was advised to call back or seek an in-person evaluation if the symptoms worsen or if the condition fails to improve as anticipated.  I provided 22 minutes of non-face-to-face time during this encounter.  Carnella Guadalajara, PA-C 04/25/23  1:29 PM

## 2023-04-25 ENCOUNTER — Encounter: Payer: Self-pay | Admitting: Physician Assistant

## 2023-04-25 ENCOUNTER — Inpatient Hospital Stay (HOSPITAL_BASED_OUTPATIENT_CLINIC_OR_DEPARTMENT_OTHER): Payer: Medicaid Other | Admitting: Physician Assistant

## 2023-04-25 DIAGNOSIS — E559 Vitamin D deficiency, unspecified: Secondary | ICD-10-CM | POA: Diagnosis not present

## 2023-04-25 DIAGNOSIS — D500 Iron deficiency anemia secondary to blood loss (chronic): Secondary | ICD-10-CM

## 2023-04-25 DIAGNOSIS — F17210 Nicotine dependence, cigarettes, uncomplicated: Secondary | ICD-10-CM

## 2023-04-25 DIAGNOSIS — E538 Deficiency of other specified B group vitamins: Secondary | ICD-10-CM

## 2023-04-25 DIAGNOSIS — D751 Secondary polycythemia: Secondary | ICD-10-CM | POA: Diagnosis not present

## 2023-04-25 MED ORDER — VITAMIN D (ERGOCALCIFEROL) 1.25 MG (50000 UNIT) PO CAPS: 50000.0000 [IU] | ORAL_CAPSULE | ORAL | 3 refills | Status: DC

## 2023-04-28 ENCOUNTER — Other Ambulatory Visit: Payer: Self-pay

## 2023-04-28 DIAGNOSIS — E559 Vitamin D deficiency, unspecified: Secondary | ICD-10-CM

## 2023-04-28 DIAGNOSIS — D751 Secondary polycythemia: Secondary | ICD-10-CM

## 2023-04-28 DIAGNOSIS — D500 Iron deficiency anemia secondary to blood loss (chronic): Secondary | ICD-10-CM

## 2023-04-28 DIAGNOSIS — E538 Deficiency of other specified B group vitamins: Secondary | ICD-10-CM

## 2023-04-29 ENCOUNTER — Inpatient Hospital Stay: Payer: Medicaid Other

## 2023-05-05 ENCOUNTER — Inpatient Hospital Stay: Payer: Medicaid Other

## 2023-05-05 VITALS — BP 141/81 | HR 96 | Temp 97.7°F | Resp 19 | Ht 67.0 in | Wt 188.2 lb

## 2023-05-05 DIAGNOSIS — E538 Deficiency of other specified B group vitamins: Secondary | ICD-10-CM

## 2023-05-05 DIAGNOSIS — D500 Iron deficiency anemia secondary to blood loss (chronic): Secondary | ICD-10-CM

## 2023-05-05 MED ORDER — CYANOCOBALAMIN 1000 MCG/ML IJ SOLN
1000.0000 ug | Freq: Once | INTRAMUSCULAR | Status: AC
  Administered 2023-05-05: 1000 ug via INTRAMUSCULAR
  Filled 2023-05-05: qty 1

## 2023-05-05 NOTE — Progress Notes (Signed)
Patient tolerated B12 injection with no complaints voiced. Site clean and dry with no bruising or swelling noted at site. See MAR for details. Band aid applied.  Patient stable during and after injection. VSS with discharge and left in satisfactory condition with no s/s of distress noted. 

## 2023-05-05 NOTE — Patient Instructions (Signed)
[facility] [place]  Discharge Instructions: Thank you for choosing [facility] to provide your oncology and hematology care.  If you have a lab appointment with the Cancer Center - please note that after April 8th, 2024, all labs will be drawn in the cancer center.  You do not have to check in or register with the main entrance as you have in the past but will complete your check-in in the cancer center.  Wear comfortable clothing and clothing appropriate for easy access to any Portacath or PICC line.   We strive to give you quality time with your provider. You may need to reschedule your appointment if you arrive late (15 or more minutes).  Arriving late affects you and other patients whose appointments are after yours.  Also, if you miss three or more appointments without notifying the office, you may be dismissed from the clinic at the provider's discretion.      For prescription refill requests, have your pharmacy contact our office and allow 72 hours for refills to be completed.    Today you received the following B12 injection, return as scheduled.   To help prevent nausea and vomiting after your treatment, we encourage you to take your nausea medication as directed.  BELOW ARE SYMPTOMS THAT SHOULD BE REPORTED IMMEDIATELY: *FEVER GREATER THAN 100.4 F (38 C) OR HIGHER *CHILLS OR SWEATING *NAUSEA AND VOMITING THAT IS NOT CONTROLLED WITH YOUR NAUSEA MEDICATION *UNUSUAL SHORTNESS OF BREATH *UNUSUAL BRUISING OR BLEEDING *URINARY PROBLEMS (pain or burning when urinating, or frequent urination) *BOWEL PROBLEMS (unusual diarrhea, constipation, pain near the anus) TENDERNESS IN MOUTH AND THROAT WITH OR WITHOUT PRESENCE OF ULCERS (sore throat, sores in mouth, or a toothache) UNUSUAL RASH, SWELLING OR PAIN  UNUSUAL VAGINAL DISCHARGE OR ITCHING   Items with * indicate a potential emergency and should be followed up as soon as possible or go to the Emergency  Department if any problems should occur.  Please show the CHEMOTHERAPY ALERT CARD or IMMUNOTHERAPY ALERT CARD at check-in to the Emergency Department and triage nurse.  Should you have questions after your visit or need to cancel or reschedule your appointment, please contact [facility] [place] [phone]  and follow the prompts.  Office hours are 8:00 a.m. to 4:30 p.m. Monday - Friday. Please note that voicemails left after 4:00 p.m. may not be returned until the following business day.  We are closed weekends and major holidays. You have access to a nurse at all times for urgent questions. Please call the main number to the clinic [phone] and follow the prompts.  For any non-urgent questions, you may also contact your provider using MyChart. We now offer e-Visits for anyone 18 and older to request care online for non-urgent symptoms. For details visit [email or web address].   Also download the MyChart app! Go to the app store, search "MyChart", open the app, select [place], and log in with your MyChart username and password.   

## 2023-05-06 ENCOUNTER — Inpatient Hospital Stay: Payer: Medicaid Other

## 2023-05-06 DIAGNOSIS — M2392 Unspecified internal derangement of left knee: Secondary | ICD-10-CM | POA: Insufficient documentation

## 2023-05-13 ENCOUNTER — Inpatient Hospital Stay: Payer: Medicaid Other

## 2023-05-13 VITALS — BP 157/76 | HR 96 | Temp 97.2°F | Resp 18

## 2023-05-13 DIAGNOSIS — E538 Deficiency of other specified B group vitamins: Secondary | ICD-10-CM

## 2023-05-13 DIAGNOSIS — D500 Iron deficiency anemia secondary to blood loss (chronic): Secondary | ICD-10-CM

## 2023-05-13 MED ORDER — CYANOCOBALAMIN 1000 MCG/ML IJ SOLN
1000.0000 ug | Freq: Once | INTRAMUSCULAR | Status: AC
  Administered 2023-05-13: 1000 ug via INTRAMUSCULAR
  Filled 2023-05-13: qty 1

## 2023-05-13 NOTE — Progress Notes (Signed)
Patient tolerated injection with no complaints voiced. Site clean and dry with no bruising or swelling noted at site. See MAR for details. Band aid applied.  Patient stable during and after injection. VSS with discharge and left in satisfactory condition with no s/s of distress noted.  

## 2023-05-13 NOTE — Patient Instructions (Signed)
[facility] [place]  Discharge Instructions: Thank you for choosing [facility] to provide your oncology and hematology care.  If you have a lab appointment with the Cancer Center - please note that after April 8th, 2024, all labs will be drawn in the cancer center.  You do not have to check in or register with the main entrance as you have in the past but will complete your check-in in the cancer center.  Wear comfortable clothing and clothing appropriate for easy access to any Portacath or PICC line.   We strive to give you quality time with your provider. You may need to reschedule your appointment if you arrive late (15 or more minutes).  Arriving late affects you and other patients whose appointments are after yours.  Also, if you miss three or more appointments without notifying the office, you may be dismissed from the clinic at the provider's discretion.      For prescription refill requests, have your pharmacy contact our office and allow 72 hours for refills to be completed.    Today you received the following B12 injection, return as scheduled.   To help prevent nausea and vomiting after your treatment, we encourage you to take your nausea medication as directed.  BELOW ARE SYMPTOMS THAT SHOULD BE REPORTED IMMEDIATELY: *FEVER GREATER THAN 100.4 F (38 C) OR HIGHER *CHILLS OR SWEATING *NAUSEA AND VOMITING THAT IS NOT CONTROLLED WITH YOUR NAUSEA MEDICATION *UNUSUAL SHORTNESS OF BREATH *UNUSUAL BRUISING OR BLEEDING *URINARY PROBLEMS (pain or burning when urinating, or frequent urination) *BOWEL PROBLEMS (unusual diarrhea, constipation, pain near the anus) TENDERNESS IN MOUTH AND THROAT WITH OR WITHOUT PRESENCE OF ULCERS (sore throat, sores in mouth, or a toothache) UNUSUAL RASH, SWELLING OR PAIN  UNUSUAL VAGINAL DISCHARGE OR ITCHING   Items with * indicate a potential emergency and should be followed up as soon as possible or go to the Emergency  Department if any problems should occur.  Please show the CHEMOTHERAPY ALERT CARD or IMMUNOTHERAPY ALERT CARD at check-in to the Emergency Department and triage nurse.  Should you have questions after your visit or need to cancel or reschedule your appointment, please contact [facility] [place] [phone]  and follow the prompts.  Office hours are 8:00 a.m. to 4:30 p.m. Monday - Friday. Please note that voicemails left after 4:00 p.m. may not be returned until the following business day.  We are closed weekends and major holidays. You have access to a nurse at all times for urgent questions. Please call the main number to the clinic [phone] and follow the prompts.  For any non-urgent questions, you may also contact your provider using MyChart. We now offer e-Visits for anyone 18 and older to request care online for non-urgent symptoms. For details visit [email or web address].   Also download the MyChart app! Go to the app store, search "MyChart", open the app, select [place], and log in with your MyChart username and password.   

## 2023-05-20 ENCOUNTER — Inpatient Hospital Stay: Payer: Medicaid Other | Attending: Physician Assistant

## 2023-05-20 VITALS — BP 125/78 | HR 80 | Temp 98.1°F | Resp 20

## 2023-05-20 DIAGNOSIS — E538 Deficiency of other specified B group vitamins: Secondary | ICD-10-CM | POA: Insufficient documentation

## 2023-05-20 DIAGNOSIS — D500 Iron deficiency anemia secondary to blood loss (chronic): Secondary | ICD-10-CM

## 2023-05-20 MED ORDER — CYANOCOBALAMIN 1000 MCG/ML IJ SOLN
1000.0000 ug | Freq: Once | INTRAMUSCULAR | Status: AC
  Administered 2023-05-20: 1000 ug via INTRAMUSCULAR
  Filled 2023-05-20: qty 1

## 2023-05-20 NOTE — Patient Instructions (Signed)
[facility] [place]  Discharge Instructions: Thank you for choosing [facility] to provide your oncology and hematology care.  If you have a lab appointment with the Cancer Center - please note that after April 8th, 2024, all labs will be drawn in the cancer center.  You do not have to check in or register with the main entrance as you have in the past but will complete your check-in in the cancer center.  Wear comfortable clothing and clothing appropriate for easy access to any Portacath or PICC line.   We strive to give you quality time with your provider. You may need to reschedule your appointment if you arrive late (15 or more minutes).  Arriving late affects you and other patients whose appointments are after yours.  Also, if you miss three or more appointments without notifying the office, you may be dismissed from the clinic at the provider's discretion.      For prescription refill requests, have your pharmacy contact our office and allow 72 hours for refills to be completed.    Today you received the following B12 injection, return as scheduled.   To help prevent nausea and vomiting after your treatment, we encourage you to take your nausea medication as directed.  BELOW ARE SYMPTOMS THAT SHOULD BE REPORTED IMMEDIATELY: *FEVER GREATER THAN 100.4 F (38 C) OR HIGHER *CHILLS OR SWEATING *NAUSEA AND VOMITING THAT IS NOT CONTROLLED WITH YOUR NAUSEA MEDICATION *UNUSUAL SHORTNESS OF BREATH *UNUSUAL BRUISING OR BLEEDING *URINARY PROBLEMS (pain or burning when urinating, or frequent urination) *BOWEL PROBLEMS (unusual diarrhea, constipation, pain near the anus) TENDERNESS IN MOUTH AND THROAT WITH OR WITHOUT PRESENCE OF ULCERS (sore throat, sores in mouth, or a toothache) UNUSUAL RASH, SWELLING OR PAIN  UNUSUAL VAGINAL DISCHARGE OR ITCHING   Items with * indicate a potential emergency and should be followed up as soon as possible or go to the Emergency  Department if any problems should occur.  Please show the CHEMOTHERAPY ALERT CARD or IMMUNOTHERAPY ALERT CARD at check-in to the Emergency Department and triage nurse.  Should you have questions after your visit or need to cancel or reschedule your appointment, please contact [facility]  [phone]  and follow the prompts.  Office hours are 8:00 a.m. to 4:30 p.m. Monday - Friday. Please note that voicemails left after 4:00 p.m. may not be returned until the following business day.  We are closed weekends and major holidays. You have access to a nurse at all times for urgent questions. Please call the main number to the clinic [phone] and follow the prompts.  For any non-urgent questions, you may also contact your provider using MyChart. We now offer e-Visits for anyone 18 and older to request care online for non-urgent symptoms. For details visit [email or web address].   Also download the MyChart app! Go to the app store, search "MyChart", open the app, select [place], and log in with your MyChart username and password.   

## 2023-05-20 NOTE — Progress Notes (Signed)
Patient tolerated injection with no complaints voiced. Site clean and dry with no bruising or swelling noted at site. See MAR for details. Band aid applied.  Patient stable during and after injection. VSS with discharge and left in satisfactory condition with no s/s of distress noted.  

## 2023-06-17 ENCOUNTER — Inpatient Hospital Stay: Payer: Medicaid Other | Attending: Physician Assistant

## 2023-06-17 VITALS — BP 154/83 | HR 76 | Temp 97.3°F | Resp 18 | Wt 190.2 lb

## 2023-06-17 DIAGNOSIS — D500 Iron deficiency anemia secondary to blood loss (chronic): Secondary | ICD-10-CM

## 2023-06-17 DIAGNOSIS — E538 Deficiency of other specified B group vitamins: Secondary | ICD-10-CM | POA: Diagnosis present

## 2023-06-17 MED ORDER — CYANOCOBALAMIN 1000 MCG/ML IJ SOLN
1000.0000 ug | Freq: Once | INTRAMUSCULAR | Status: AC
  Administered 2023-06-17: 1000 ug via INTRAMUSCULAR
  Filled 2023-06-17: qty 1

## 2023-06-17 NOTE — Patient Instructions (Signed)
[facility]  Discharge Instructions: Thank you for choosing [facility] to provide your oncology and hematology care.  If you have a lab appointment with the Cancer Center - please note that after April 8th, 2024, all labs will be drawn in the cancer center.  You do not have to check in or register with the main entrance as you have in the past but will complete your check-in in the cancer center.  Wear comfortable clothing and clothing appropriate for easy access to any Portacath or PICC line.   We strive to give you quality time with your provider. You may need to reschedule your appointment if you arrive late (15 or more minutes).  Arriving late affects you and other patients whose appointments are after yours.  Also, if you miss three or more appointments without notifying the office, you may be dismissed from the clinic at the provider's discretion.      For prescription refill requests, have your pharmacy contact our office and allow 72 hours for refills to be completed.    Today you received the following:  Vitamin B12  Vitamin B12 Injection What is this medication? Vitamin B12 (VAHY tuh min B12) prevents and treats low vitamin B12 levels in your body. It is used in people who do not get enough vitamin B12 from their diet or when their digestive tract does not absorb enough. Vitamin B12 plays an important role in maintaining the health of your nervous system and red blood cells. This medicine may be used for other purposes; ask your health care provider or pharmacist if you have questions. COMMON BRAND NAME(S): B-12 Compliance Kit, B-12 Injection Kit, Cyomin, Dodex, LA-12, Nutri-Twelve, Physicians EZ Use B-12, Primabalt What should I tell my care team before I take this medication? They need to know if you have any of these conditions: Kidney disease Leber's disease Megaloblastic anemia An unusual or allergic reaction to cyanocobalamin, cobalt, other  medications, foods, dyes, or preservatives Pregnant or trying to get pregnant Breast-feeding How should I use this medication? This medication is injected into a muscle or deeply under the skin. It is usually given in a clinic or care team's office. However, your care team may teach you how to inject yourself. Follow all instructions. Talk to your care team about the use of this medication in children. Special care may be needed. Overdosage: If you think you have taken too much of this medicine contact a poison control center or emergency room at once. NOTE: This medicine is only for you. Do not share this medicine with others. What if I miss a dose? If you are given your dose at a clinic or care team's office, call to reschedule your appointment. If you give your own injections, and you miss a dose, take it as soon as you can. If it is almost time for your next dose, take only that dose. Do not take double or extra doses. What may interact with this medication? Alcohol Colchicine This list may not describe all possible interactions. Give your health care provider a list of all the medicines, herbs, non-prescription drugs, or dietary supplements you use. Also tell them if you smoke, drink alcohol, or use illegal drugs. Some items may interact with your medicine. What should I watch for while using this medication? Visit your care team regularly. You may need blood work done while you are taking this medication. You may need to follow a special diet. Talk to your care  team. Limit your alcohol intake and avoid smoking to get the best benefit. What side effects may I notice from receiving this medication? Side effects that you should report to your care team as soon as possible: Allergic reactions--skin rash, itching, hives, swelling of the face, lips, tongue, or throat Swelling of the ankles, hands, or feet Trouble breathing Side effects that usually do not require medical attention (report to  your care team if they continue or are bothersome): Diarrhea This list may not describe all possible side effects. Call your doctor for medical advice about side effects. You may report side effects to FDA at 1-800-FDA-1088. Where should I keep my medication? Keep out of the reach of children. Store at room temperature between 15 and 30 degrees C (59 and 85 degrees F). Protect from light. Throw away any unused medication after the expiration date. NOTE: This sheet is a summary. It may not cover all possible information. If you have questions about this medicine, talk to your doctor, pharmacist, or health care provider.  2024 Elsevier/Gold Standard (2021-08-14 00:00:00)     To help prevent nausea and vomiting after your treatment, we encourage you to take your nausea medication as directed.  BELOW ARE SYMPTOMS THAT SHOULD BE REPORTED IMMEDIATELY: *FEVER GREATER THAN 100.4 F (38 C) OR HIGHER *CHILLS OR SWEATING *NAUSEA AND VOMITING THAT IS NOT CONTROLLED WITH YOUR NAUSEA MEDICATION *UNUSUAL SHORTNESS OF BREATH *UNUSUAL BRUISING OR BLEEDING *URINARY PROBLEMS (pain or burning when urinating, or frequent urination) *BOWEL PROBLEMS (unusual diarrhea, constipation, pain near the anus) TENDERNESS IN MOUTH AND THROAT WITH OR WITHOUT PRESENCE OF ULCERS (sore throat, sores in mouth, or a toothache) UNUSUAL RASH, SWELLING OR PAIN  UNUSUAL VAGINAL DISCHARGE OR ITCHING   Items with * indicate a potential emergency and should be followed up as soon as possible or go to the Emergency Department if any problems should occur.  Please show the CHEMOTHERAPY ALERT CARD or IMMUNOTHERAPY ALERT CARD at check-in to the Emergency Department and triage nurse.  Should you have questions after your visit or need to cancel or reschedule your appointment, please contact [facility] [phone]  and follow the prompts.  Office hours are 8:00 a.m. to 4:30 p.m. Monday - Friday. Please note that  voicemails left after 4:00 p.m. may not be returned until the following business day.  We are closed weekends and major holidays. You have access to a nurse at all times for urgent questions. Please call the main number to the clinic [phone] and follow the prompts.  For any non-urgent questions, you may also contact your provider using MyChart. We now offer e-Visits for anyone 98 and older to request care online for non-urgent symptoms. For details visit [email or web address].   Also download the MyChart app! Go to the app store, search "MyChart", open the app, select [place], and log in with your MyChart username and password.

## 2023-06-17 NOTE — Progress Notes (Signed)
Patient tolerated Vitamin B 12 injection with no complaints voiced.  Site clean and dry with no bruising or swelling noted.  No complaints of pain.  Discharged with vital signs stable and no signs or symptoms of distress noted.   ?

## 2023-07-15 ENCOUNTER — Inpatient Hospital Stay: Payer: Medicaid Other

## 2023-07-15 VITALS — BP 154/84 | HR 89 | Temp 98.4°F | Resp 18

## 2023-07-15 DIAGNOSIS — E538 Deficiency of other specified B group vitamins: Secondary | ICD-10-CM | POA: Diagnosis not present

## 2023-07-15 DIAGNOSIS — D500 Iron deficiency anemia secondary to blood loss (chronic): Secondary | ICD-10-CM

## 2023-07-15 MED ORDER — CYANOCOBALAMIN 1000 MCG/ML IJ SOLN
1000.0000 ug | Freq: Once | INTRAMUSCULAR | Status: AC
  Administered 2023-07-15: 1000 ug via INTRAMUSCULAR
  Filled 2023-07-15: qty 1

## 2023-07-15 NOTE — Patient Instructions (Signed)
[facility]  Discharge Instructions: Thank you for choosing [facility] to provide your oncology and hematology care.  If you have a lab appointment with the Cancer Center - please note that after April 8th, 2024, all labs will be drawn in the cancer center.  You do not have to check in or register with the main entrance as you have in the past but will complete your check-in in the cancer center.  Wear comfortable clothing and clothing appropriate for easy access to any Portacath or PICC line.   We strive to give you quality time with your provider. You may need to reschedule your appointment if you arrive late (15 or more minutes).  Arriving late affects you and other patients whose appointments are after yours.  Also, if you miss three or more appointments without notifying the office, you may be dismissed from the clinic at the provider's discretion.      For prescription refill requests, have your pharmacy contact our office and allow 72 hours for refills to be completed.    Today you received the following, B12 injection    To help prevent nausea and vomiting after your treatment, we encourage you to take your nausea medication as directed.  BELOW ARE SYMPTOMS THAT SHOULD BE REPORTED IMMEDIATELY: *FEVER GREATER THAN 100.4 F (38 C) OR HIGHER *CHILLS OR SWEATING *NAUSEA AND VOMITING THAT IS NOT CONTROLLED WITH YOUR NAUSEA MEDICATION *UNUSUAL SHORTNESS OF BREATH *UNUSUAL BRUISING OR BLEEDING *URINARY PROBLEMS (pain or burning when urinating, or frequent urination) *BOWEL PROBLEMS (unusual diarrhea, constipation, pain near the anus) TENDERNESS IN MOUTH AND THROAT WITH OR WITHOUT PRESENCE OF ULCERS (sore throat, sores in mouth, or a toothache) UNUSUAL RASH, SWELLING OR PAIN  UNUSUAL VAGINAL DISCHARGE OR ITCHING   Items with * indicate a potential emergency and should be followed up as soon as possible or go to the Emergency Department if any problems  should occur.  Please show the CHEMOTHERAPY ALERT CARD or IMMUNOTHERAPY ALERT CARD at check-in to the Emergency Department and triage nurse.  Should you have questions after your visit or need to cancel or reschedule your appointment, please contact [facility] [phone]  and follow the prompts.  Office hours are 8:00 a.m. to 4:30 p.m. Monday - Friday. Please note that voicemails left after 4:00 p.m. may not be returned until the following business day.  We are closed weekends and major holidays. You have access to a nurse at all times for urgent questions. Please call the main number to the clinic [phone] and follow the prompts.  For any non-urgent questions, you may also contact your provider using MyChart. We now offer e-Visits for anyone 66 and older to request care online for non-urgent symptoms. For details visit [email or web address].   Also download the MyChart app! Go to the app store, search "MyChart", open the app, select [place], and log in with your MyChart username and password.

## 2023-07-15 NOTE — Progress Notes (Signed)
Amy Andersen presents today for injection per MD orders. B12 1000 mcg administered SQ in right Upper Arm. Administration without incident. Patient tolerated well.   Vitals stable and discharged home from clinic ambulatory. Follow up as scheduled.'

## 2023-08-12 ENCOUNTER — Inpatient Hospital Stay: Payer: Medicaid Other

## 2023-09-09 ENCOUNTER — Inpatient Hospital Stay: Payer: Medicaid Other | Attending: Physician Assistant

## 2023-10-07 ENCOUNTER — Inpatient Hospital Stay: Payer: Medicaid Other | Attending: Physician Assistant

## 2023-10-07 VITALS — BP 133/75 | HR 80 | Temp 98.0°F | Resp 18

## 2023-10-07 DIAGNOSIS — E538 Deficiency of other specified B group vitamins: Secondary | ICD-10-CM | POA: Insufficient documentation

## 2023-10-07 DIAGNOSIS — D500 Iron deficiency anemia secondary to blood loss (chronic): Secondary | ICD-10-CM

## 2023-10-07 MED ORDER — CYANOCOBALAMIN 1000 MCG/ML IJ SOLN
1000.0000 ug | Freq: Once | INTRAMUSCULAR | Status: AC
  Administered 2023-10-07: 1000 ug via INTRAMUSCULAR
  Filled 2023-10-07: qty 1

## 2023-10-07 NOTE — Progress Notes (Signed)
Patient tolerated B12 injection with no complaints voiced.  Site clean and dry with no bruising or swelling noted at site.  See MAR for details.  Band aid applied.  Patient stable during and after injection.  Vss with discharge and left in satisfactory condition with no s/s of distress noted. All follow ups as scheduled.  Amy Andersen [employer]

## 2023-10-07 NOTE — Patient Instructions (Signed)
Vitamin B12 Capsules or Tablets What is this medication? VITAMIN B12 (VAHY tuh min B12) prevents and treats low vitamin B12 levels in your body. It is used in people who do not get enough vitamin B12 from their diet or when their digestive tract does not absorb enough. Vitamin B12 plays an important role in maintaining the health of your nervous system and red blood cells. This medicine may be used for other purposes; ask your health care provider or pharmacist if you have questions. What should I tell my care team before I take this medication? They need to know if you have any of these conditions: Anemia Kidney disease Leber's disease Malabsorption disorder An unusual or allergic reaction to cyanocobalamin, cobalt, other medications, foods, dyes, or preservatives Pregnant or trying to get pregnant Breast-feeding How should I use this medication? Take this medication by mouth with a glass of water. Follow the directions on the package or prescription label. If you are taking the tablets, do not chew, cut, or crush this medication. If using a vitamin solution, use a specially marked spoon or dropper to measure each dose. Ask your pharmacist if you do not have one. Household spoons are not accurate. For best results take this vitamin with food. Take your medication at regular intervals. Do not take your medication more often than directed. Talk to your care team about the use of this medication in children. While this medication may be prescribed for selected conditions, precautions do apply. Overdosage: If you think you have taken too much of this medicine contact a poison control center or emergency room at once. NOTE: This medicine is only for you. Do not share this medicine with others. What if I miss a dose? If you miss a dose, take it as soon as you can. If it is almost time for your next dose, take only that dose. Do not take double or extra doses. What may interact with this  medication? Alcohol Aminosalicylic acid Colchicine Medications that suppress your bone marrow, such as chemotherapy, chloramphenicol This list may not describe all possible interactions. Give your health care provider a list of all the medicines, herbs, non-prescription drugs, or dietary supplements you use. Also tell them if you smoke, drink alcohol, or use illegal drugs. Some items may interact with your medicine. What should I watch for while using this medication? Follow a healthy diet. Taking a vitamin supplement does not replace the need for a balanced diet. Some foods that have vitamin B12 naturally are fish, seafood, egg yolk, milk, and fermented cheese. Too much of this vitamin can be unsafe. Talk to your care team about how much is right for you. What side effects may I notice from receiving this medication? Side effects that you should report to your care team as soon as possible: Allergic reactions--skin rash, itching, hives, swelling of the face, lips, tongue, or throat Side effects that usually do not require medical attention (report to your care team if they continue or are bothersome): Diarrhea Fatigue Headache Nausea This list may not describe all possible side effects. Call your doctor for medical advice about side effects. You may report side effects to FDA at 1-800-FDA-1088. Where should I keep my medication? Keep out of the reach of children and pets. Store at room temperature between 15 and 30 degrees C (59 and 85 degrees F). Protect from heat and light. Throw away any unused medication after the expiration date. NOTE: This sheet is a summary. It may not cover all  possible information. If you have questions about this medicine, talk to your doctor, pharmacist, or health care provider.  2024 Elsevier/Gold Standard (2021-08-14 00:00:00)

## 2023-11-04 ENCOUNTER — Inpatient Hospital Stay: Payer: Medicaid Other | Attending: Physician Assistant

## 2023-11-04 ENCOUNTER — Inpatient Hospital Stay: Payer: Medicaid Other

## 2023-11-04 VITALS — BP 149/82 | HR 100 | Temp 98.7°F | Resp 19

## 2023-11-04 DIAGNOSIS — E538 Deficiency of other specified B group vitamins: Secondary | ICD-10-CM | POA: Diagnosis present

## 2023-11-04 DIAGNOSIS — D500 Iron deficiency anemia secondary to blood loss (chronic): Secondary | ICD-10-CM | POA: Insufficient documentation

## 2023-11-04 DIAGNOSIS — E559 Vitamin D deficiency, unspecified: Secondary | ICD-10-CM | POA: Insufficient documentation

## 2023-11-04 DIAGNOSIS — D751 Secondary polycythemia: Secondary | ICD-10-CM | POA: Diagnosis not present

## 2023-11-04 LAB — IRON AND TIBC
Iron: 142 ug/dL (ref 28–170)
Saturation Ratios: 40 % — ABNORMAL HIGH (ref 10.4–31.8)
TIBC: 351 ug/dL (ref 250–450)
UIBC: 209 ug/dL

## 2023-11-04 LAB — CBC WITH DIFFERENTIAL/PLATELET
Abs Immature Granulocytes: 0.02 10*3/uL (ref 0.00–0.07)
Basophils Absolute: 0.1 10*3/uL (ref 0.0–0.1)
Basophils Relative: 1 %
Eosinophils Absolute: 0.4 10*3/uL (ref 0.0–0.5)
Eosinophils Relative: 5 %
HCT: 51.8 % — ABNORMAL HIGH (ref 36.0–46.0)
Hemoglobin: 16.8 g/dL — ABNORMAL HIGH (ref 12.0–15.0)
Immature Granulocytes: 0 %
Lymphocytes Relative: 35 %
Lymphs Abs: 2.3 10*3/uL (ref 0.7–4.0)
MCH: 31.3 pg (ref 26.0–34.0)
MCHC: 32.4 g/dL (ref 30.0–36.0)
MCV: 96.5 fL (ref 80.0–100.0)
Monocytes Absolute: 0.2 10*3/uL (ref 0.1–1.0)
Monocytes Relative: 3 %
Neutro Abs: 3.7 10*3/uL (ref 1.7–7.7)
Neutrophils Relative %: 56 %
Platelets: 279 10*3/uL (ref 150–400)
RBC: 5.37 MIL/uL — ABNORMAL HIGH (ref 3.87–5.11)
RDW: 12.4 % (ref 11.5–15.5)
WBC: 6.7 10*3/uL (ref 4.0–10.5)
nRBC: 0 % (ref 0.0–0.2)

## 2023-11-04 LAB — VITAMIN D 25 HYDROXY (VIT D DEFICIENCY, FRACTURES): Vit D, 25-Hydroxy: 37.81 ng/mL (ref 30–100)

## 2023-11-04 LAB — VITAMIN B12: Vitamin B-12: 311 pg/mL (ref 180–914)

## 2023-11-04 LAB — FERRITIN: Ferritin: 175 ng/mL (ref 11–307)

## 2023-11-04 MED ORDER — CYANOCOBALAMIN 1000 MCG/ML IJ SOLN
1000.0000 ug | Freq: Once | INTRAMUSCULAR | Status: AC
  Administered 2023-11-04: 1000 ug via INTRAMUSCULAR
  Filled 2023-11-04: qty 1

## 2023-11-04 NOTE — Patient Instructions (Signed)
 [facility] - A DEPT OF [facility]  Discharge Instructions: Thank you for choosing [facility] to provide your oncology and hematology care.  If you have a lab appointment with the Cancer Center - please note that after April 8th, 2024, all labs will be drawn in the cancer center.  You do not have to check in or register with the main entrance as you have in the past but will complete your check-in in the cancer center.  Wear comfortable clothing and clothing appropriate for easy access to any Portacath or PICC line.   We strive to give you quality time with your provider. You may need to reschedule your appointment if you arrive late (15 or more minutes).  Arriving late affects you and other patients whose appointments are after yours.  Also, if you miss three or more appointments without notifying the office, you may be dismissed from the clinic at the provider's discretion.      For prescription refill requests, have your pharmacy contact our office and allow 72 hours for refills to be completed.    Today you received the following:  Vitamin B12  Vitamin B12 Injection What is this medication? Vitamin B12 (VAHY tuh min B12) prevents and treats low vitamin B12 levels in your body. It is used in people who do not get enough vitamin B12 from their diet or when their digestive tract does not absorb enough. Vitamin B12 plays an important role in maintaining the health of your nervous system and red blood cells. This medicine may be used for other purposes; ask your health care provider or pharmacist if you have questions. COMMON BRAND NAME(S): B-12 Compliance Kit, B-12 Injection Kit, Cyomin, Dodex, LA-12, Nutri-Twelve, Physicians EZ Use B-12, Primabalt, Vitamin Deficiency Injectable System - B12 What should I tell my care team before I take this medication? They need to know if you have any of these conditions: Kidney disease Leber's  disease Megaloblastic anemia An unusual or allergic reaction to cyanocobalamin, cobalt, other medications, foods, dyes, or preservatives Pregnant or trying to get pregnant Breast-feeding How should I use this medication? This medication is injected into a muscle or deeply under the skin. It is usually given in a clinic or care team's office. However, your care team may teach you how to inject yourself. Follow all instructions. Talk to your care team about the use of this medication in children. Special care may be needed. Overdosage: If you think you have taken too much of this medicine contact a poison control center or emergency room at once. NOTE: This medicine is only for you. Do not share this medicine with others. What if I miss a dose? If you are given your dose at a clinic or care team's office, call to reschedule your appointment. If you give your own injections, and you miss a dose, take it as soon as you can. If it is almost time for your next dose, take only that dose. Do not take double or extra doses. What may interact with this medication? Alcohol Colchicine This list may not describe all possible interactions. Give your health care provider a list of all the medicines, herbs, non-prescription drugs, or dietary supplements you use. Also tell them if you smoke, drink alcohol, or use illegal drugs. Some items may interact with your medicine. What should I watch for while using this medication? Visit your care team regularly. You may need blood work done while you are taking  this medication. You may need to follow a special diet. Talk to your care team. Limit your alcohol intake and avoid smoking to get the best benefit. What side effects may I notice from receiving this medication? Side effects that you should report to your care team as soon as possible: Allergic reactions--skin rash, itching, hives, swelling of the face, lips, tongue, or throat Swelling of the ankles, hands, or  feet Trouble breathing Side effects that usually do not require medical attention (report to your care team if they continue or are bothersome): Diarrhea This list may not describe all possible side effects. Call your doctor for medical advice about side effects. You may report side effects to FDA at 1-800-FDA-1088. Where should I keep my medication? Keep out of the reach of children. Store at room temperature between 15 and 30 degrees C (59 and 85 degrees F). Protect from light. Throw away any unused medication after the expiration date. NOTE: This sheet is a summary. It may not cover all possible information. If you have questions about this medicine, talk to your doctor, pharmacist, or health care provider.  2024 Elsevier/Gold Standard (2021-08-14 00:00:00)     To help prevent nausea and vomiting after your treatment, we encourage you to take your nausea medication as directed.  BELOW ARE SYMPTOMS THAT SHOULD BE REPORTED IMMEDIATELY: *FEVER GREATER THAN 100.4 F (38 C) OR HIGHER *CHILLS OR SWEATING *NAUSEA AND VOMITING THAT IS NOT CONTROLLED WITH YOUR NAUSEA MEDICATION *UNUSUAL SHORTNESS OF BREATH *UNUSUAL BRUISING OR BLEEDING *URINARY PROBLEMS (pain or burning when urinating, or frequent urination) *BOWEL PROBLEMS (unusual diarrhea, constipation, pain near the anus) TENDERNESS IN MOUTH AND THROAT WITH OR WITHOUT PRESENCE OF ULCERS (sore throat, sores in mouth, or a toothache) UNUSUAL RASH, SWELLING OR PAIN  UNUSUAL VAGINAL DISCHARGE OR ITCHING   Items with * indicate a potential emergency and should be followed up as soon as possible or go to the Emergency Department if any problems should occur.  Please show the CHEMOTHERAPY ALERT CARD or IMMUNOTHERAPY ALERT CARD at check-in to the Emergency Department and triage nurse.  Should you have questions after your visit or need to cancel or reschedule your appointment, please contact [facility] - A DEPT OF [facility] [phone]  and follow the prompts.  Office hours are 8:00 a.m. to 4:30 p.m. Monday - Friday. Please note that voicemails left after 4:00 p.m. may not be returned until the following business day.  We are closed weekends and major holidays. You have access to a nurse at all times for urgent questions. Please call the main number to the clinic [phone] and follow the prompts.  For any non-urgent questions, you may also contact your provider using MyChart. We now offer e-Visits for anyone 51 and older to request care online for non-urgent symptoms. For details visit [email or web address].   Also download the MyChart app! Go to the app store, search "MyChart", open the app, select , and log in with your MyChart username and password.

## 2023-11-04 NOTE — Progress Notes (Signed)
Patient tolerated Vitamin B 12 injection with no complaints voiced.  Site clean and dry with no bruising or swelling noted.  No complaints of pain.  Discharged with vital signs stable and no signs or symptoms of distress noted.   ?

## 2023-11-07 ENCOUNTER — Inpatient Hospital Stay (HOSPITAL_BASED_OUTPATIENT_CLINIC_OR_DEPARTMENT_OTHER): Payer: Medicaid Other | Admitting: Oncology

## 2023-11-07 DIAGNOSIS — E538 Deficiency of other specified B group vitamins: Secondary | ICD-10-CM

## 2023-11-07 DIAGNOSIS — E559 Vitamin D deficiency, unspecified: Secondary | ICD-10-CM | POA: Diagnosis not present

## 2023-11-07 DIAGNOSIS — D751 Secondary polycythemia: Secondary | ICD-10-CM | POA: Diagnosis not present

## 2023-11-07 DIAGNOSIS — D500 Iron deficiency anemia secondary to blood loss (chronic): Secondary | ICD-10-CM | POA: Diagnosis not present

## 2023-11-07 LAB — METHYLMALONIC ACID, SERUM: Methylmalonic Acid, Quantitative: 164 nmol/L (ref 0–378)

## 2023-11-07 MED ORDER — CYANOCOBALAMIN 1000 MCG/ML IJ SOLN: 1000.0000 ug | INTRAMUSCULAR | 0 refills | Status: DC

## 2023-11-07 NOTE — Progress Notes (Unsigned)
Virtual Visit via Telephone Note  I connected with Amy Andersen on 11/07/23 at  2:30 PM EST by telephone and verified that I am speaking with the correct person using two identifiers.  Location: Patient: Home  Provider: Clinic    I discussed the limitations, risks, security and privacy concerns of performing an evaluation and management service by telephone and the availability of in person appointments. I also discussed with the patient that there may be a patient responsible charge related to this service. The patient expressed understanding and agreed to proceed.   History of Present Illness: [name] is a 47 year old female with past medical history significant for iron deficiency anemia receiving intermittent IV iron who was last evaluated in clinic on 04/25/2023.  She also has past medical history significant for erythrocytosis, B12 deficiency and vitamin D deficiency.  Today, she reports appetite of 70% energy levels are 25%.  Has bilateral knee pain rates it a 6 out of 10.  Has trouble sleeping at times.  In the interim, she denies any hospitalizations, surgeries or changes to her baseline health.  She last received IV Venofer on 10/19/2021.  She is currently receiving monthly B12 injections; last given on 11/04/2023.  No sigbificant change in fatigue since starting.  December 10th have left knee rarthroplasty.     Observations/Objective: Review of Systems  Musculoskeletal:  Positive for joint pain (Bilateral knees).  Psychiatric/Behavioral:  The patient has insomnia.    Physical Exam Neurological:     Mental Status: She is alert and oriented to person, place, and time.     Assessment and Plan: 1. Iron deficiency anemia due to chronic blood loss -Initially found to have a hemoglobin of 6.8 back in March 2019. -She is unable to tolerate oral iron secondary to GI upset. -She last received IV Venofer on 10/19/2021. -Most recent labs show ferritin of 175 and iron  saturation 40%.  Hemoglobin is 16.8.  2. Erythrocytosis -Likely secondary to smoking.  JAK2 negative.  Erythropoietin normal.  Elevated carbon monoxide 8.7%. -Most recent hemoglobin was 16.8 with hematocrit of 51.8.  The rest of her differential is normal. -She is symptomatic with occasional aquagenic pruritus, headaches, dizziness, and intermittent blurry vision. She denies any erythromelalgia, Raynaud's phenomenon, or other vasomotor symptoms.  -No indication for phlebotomy at this time.  Discussed that phlebotomy would be indicated in the setting of HCT >54.0 for severe vasomotor symptoms. - No indication for aspirin in the absence of any other cardiac risk factors.  3. B12 deficiency -She has been receiving monthly B12 injections since May. -Labs from 11/04/2023 show a vitamin B12 level of 311 (170) and MMA is 164. Patient would like to self administer B12 at home.  4. Vitamin D deficiency -Currently on vitamin D and calcium tablets. -Most recent vitamin D level from 11/04/2023 was 37.81.  Follow Up Instructions:    I discussed the assessment and treatment plan with the patient. The patient was provided an opportunity to ask questions and all were answered. The patient agreed with the plan and demonstrated an understanding of the instructions.   The patient was advised to call back or seek an in-person evaluation if the symptoms worsen or if the condition fails to improve as anticipated.  I provided *** minutes of non-face-to-face time during this encounter.   Amy Kaufmann, NP

## 2023-11-08 ENCOUNTER — Encounter: Payer: Self-pay | Admitting: Hematology

## 2024-04-29 ENCOUNTER — Other Ambulatory Visit: Payer: Self-pay

## 2024-04-29 DIAGNOSIS — D500 Iron deficiency anemia secondary to blood loss (chronic): Secondary | ICD-10-CM

## 2024-04-29 DIAGNOSIS — E538 Deficiency of other specified B group vitamins: Secondary | ICD-10-CM

## 2024-04-29 DIAGNOSIS — E559 Vitamin D deficiency, unspecified: Secondary | ICD-10-CM

## 2024-04-29 DIAGNOSIS — D751 Secondary polycythemia: Secondary | ICD-10-CM

## 2024-04-30 ENCOUNTER — Inpatient Hospital Stay: Payer: Medicaid Other | Attending: Hematology

## 2024-04-30 DIAGNOSIS — E538 Deficiency of other specified B group vitamins: Secondary | ICD-10-CM | POA: Diagnosis not present

## 2024-04-30 DIAGNOSIS — D500 Iron deficiency anemia secondary to blood loss (chronic): Secondary | ICD-10-CM | POA: Insufficient documentation

## 2024-04-30 DIAGNOSIS — E559 Vitamin D deficiency, unspecified: Secondary | ICD-10-CM | POA: Diagnosis not present

## 2024-04-30 DIAGNOSIS — L299 Pruritus, unspecified: Secondary | ICD-10-CM | POA: Diagnosis not present

## 2024-04-30 DIAGNOSIS — D751 Secondary polycythemia: Secondary | ICD-10-CM | POA: Diagnosis not present

## 2024-04-30 DIAGNOSIS — F17210 Nicotine dependence, cigarettes, uncomplicated: Secondary | ICD-10-CM | POA: Diagnosis not present

## 2024-04-30 LAB — CBC WITH DIFFERENTIAL/PLATELET
Abs Immature Granulocytes: 0.04 10*3/uL (ref 0.00–0.07)
Basophils Absolute: 0.1 10*3/uL (ref 0.0–0.1)
Basophils Relative: 1 %
Eosinophils Absolute: 0.2 10*3/uL (ref 0.0–0.5)
Eosinophils Relative: 3 %
HCT: 48.2 % — ABNORMAL HIGH (ref 36.0–46.0)
Hemoglobin: 15.8 g/dL — ABNORMAL HIGH (ref 12.0–15.0)
Immature Granulocytes: 0 %
Lymphocytes Relative: 38 %
Lymphs Abs: 3.4 10*3/uL (ref 0.7–4.0)
MCH: 31.9 pg (ref 26.0–34.0)
MCHC: 32.8 g/dL (ref 30.0–36.0)
MCV: 97.2 fL (ref 80.0–100.0)
Monocytes Absolute: 0.6 10*3/uL (ref 0.1–1.0)
Monocytes Relative: 7 %
Neutro Abs: 4.6 10*3/uL (ref 1.7–7.7)
Neutrophils Relative %: 51 %
Platelets: 289 10*3/uL (ref 150–400)
RBC: 4.96 MIL/uL (ref 3.87–5.11)
RDW: 12.6 % (ref 11.5–15.5)
WBC: 8.9 10*3/uL (ref 4.0–10.5)
nRBC: 0 % (ref 0.0–0.2)

## 2024-04-30 LAB — COMPREHENSIVE METABOLIC PANEL WITH GFR
ALT: 23 U/L (ref 0–44)
AST: 18 U/L (ref 15–41)
Albumin: 3.5 g/dL (ref 3.5–5.0)
Alkaline Phosphatase: 66 U/L (ref 38–126)
Anion gap: 9 (ref 5–15)
BUN: 10 mg/dL (ref 6–20)
CO2: 28 mmol/L (ref 22–32)
Calcium: 9.2 mg/dL (ref 8.9–10.3)
Chloride: 99 mmol/L (ref 98–111)
Creatinine, Ser: 0.73 mg/dL (ref 0.44–1.00)
GFR, Estimated: >60 mL/min (ref >60)
Glucose, Bld: 92 mg/dL (ref 70–99)
Potassium: 3.9 mmol/L (ref 3.5–5.1)
Sodium: 136 mmol/L (ref 135–145)
Total Bilirubin: 0.5 mg/dL (ref 0.0–1.2)
Total Protein: 7.2 g/dL (ref 6.5–8.1)

## 2024-04-30 LAB — VITAMIN D 25 HYDROXY (VIT D DEFICIENCY, FRACTURES): Vit D, 25-Hydroxy: 24.95 ng/mL — ABNORMAL LOW (ref 30–100)

## 2024-04-30 LAB — FERRITIN: Ferritin: 160 ng/mL (ref 11–307)

## 2024-04-30 LAB — IRON AND TIBC
Iron: 77 ug/dL (ref 28–170)
Saturation Ratios: 21 % (ref 10.4–31.8)
TIBC: 361 ug/dL (ref 250–450)
UIBC: 284 ug/dL

## 2024-04-30 LAB — VITAMIN B12: Vitamin B-12: 295 pg/mL (ref 180–914)

## 2024-05-03 LAB — METHYLMALONIC ACID, SERUM: Methylmalonic Acid, Quantitative: 362 nmol/L (ref 0–378)

## 2024-05-07 ENCOUNTER — Inpatient Hospital Stay: Payer: Medicaid Other | Admitting: Oncology

## 2024-05-07 VITALS — BP 127/84 | HR 85 | Temp 98.1°F | Resp 18 | Ht 67.0 in | Wt 195.5 lb

## 2024-05-07 DIAGNOSIS — E559 Vitamin D deficiency, unspecified: Secondary | ICD-10-CM | POA: Diagnosis not present

## 2024-05-07 DIAGNOSIS — E538 Deficiency of other specified B group vitamins: Secondary | ICD-10-CM | POA: Diagnosis not present

## 2024-05-07 DIAGNOSIS — D500 Iron deficiency anemia secondary to blood loss (chronic): Secondary | ICD-10-CM

## 2024-05-07 DIAGNOSIS — D751 Secondary polycythemia: Secondary | ICD-10-CM | POA: Diagnosis not present

## 2024-05-07 MED ORDER — CYANOCOBALAMIN 1000 MCG/ML IJ SOLN: 1000.0000 ug | INTRAMUSCULAR | 0 refills | Status: DC

## 2024-05-07 NOTE — Progress Notes (Unsigned)
 [facility] [address], [place], [place] [address] Main: [phone] Fax: [phone]  Follow-up Visit    Patient Care Team: Patient, No Pcp Per as PCP - General (General Practice)   Name of the patient: Amy Andersen  [id number]  August 01, 1976   Date of visit: 05/07/2024   History of Present Illness: Amy Andersen is a 48 year old female with past medical history significant for iron  deficiency anemia receiving intermittent IV iron  who was last evaluated in clinic on 11/07/23.  She also has past medical history significant for erythrocytosis, B12 deficiency and vitamin D  deficiency.  Today, she reports appetite of 70% energy levels are 25%.  Has bilateral knee pain rates it a 6 out of 10.  Has trouble sleeping at times.  Reports no significant change to fatigue although it is not worse.    In the interim, she denies any hospitalizations, surgeries or changes to her baseline health.  She last received IV Venofer  on 10/19/2021.  She is currently receiving monthly B12 injections; last given on 11/04/2023.  She denies any melena, hematochezia or rectal bleeding.  She reports her menstrual cycles are irregular but fairly light.  She has chronic pica cravings for ice.  She continues to smoke 0.5 packs/day.  She has occasional aqua genic pruritus and intermittent blurry vision.  Reports headaches and intermittent dizziness.  Denies any erythromelalgia, Raynaud's phenomenon or other vasomotor symptoms.  Reports she had left knee surgery 11/25/2023 at [facility].  Reports that surgery went well but she was out of work for 3 months.  Reports she is slowly starting to build back up her strength.  Observations/Objective: Review of Systems  Constitutional:  Positive for malaise/fatigue.  Musculoskeletal:  Positive for joint pain.  Neurological:  Positive for dizziness.  Psychiatric/Behavioral:  The patient has insomnia.    Physical  Exam Constitutional:      Appearance: Normal appearance.  Cardiovascular:     Rate and Rhythm: Normal rate and regular rhythm.  Pulmonary:     Effort: Pulmonary effort is normal.     Breath sounds: Normal breath sounds.  Abdominal:     General: Bowel sounds are normal.     Palpations: Abdomen is soft.  Musculoskeletal:        General: No swelling. Normal range of motion.  Neurological:     Mental Status: She is alert and oriented to person, place, and time. Mental status is at baseline.     Assessment and Plan: 1. Iron  deficiency anemia due to chronic blood loss -Initially found to have a hemoglobin of 6.8 back in March 2019. -She is unable to tolerate oral iron  secondary to GI upset. -She last received IV Venofer  on 10/19/2021. -Most recent labs show ferritin of 160 and iron  saturation 21%.  Hemoglobin is 15.8. -No additional IV iron  needed at this time.  2. Erythrocytosis -Likely secondary to smoking.  JAK2 negative.  Erythropoietin  normal.  Elevated carbon monoxide 8.7%. -Most recent hemoglobin was 15.8 with hematocrit of 48.2.  The rest of her differential is normal. -She is symptomatic with occasional aquagenic pruritus, headaches, dizziness, and intermittent blurry vision. She denies any erythromelalgia, Raynaud's phenomenon, or other vasomotor symptoms.  -No indication for phlebotomy at this time.  Discussed that phlebotomy would be indicated in the setting of HCT >54.0 for severe vasomotor symptoms. - No indication for aspirin in the absence of any other cardiac risk factors.   3. B12 deficiency -She has been receiving monthly  B12 injections since May 2024. -Labs from 04/30/24 show a vitamin B12 level of 295 (311) and MMA is 362. -Patient would like to self administer B12 at home. -New prescription sent to pharmacy.   4. Vitamin D  deficiency -Currently on vitamin D  and calcium tablets. -Most recent vitamin D  level from 04/30/24 was 24.95 (37.81).  -Increase vitamin D   from 2000 units to 5000 units daily.  We will recheck at next visit.  Follow Up Instructions: -Patient to give herself monthly B12 injections.  New prescription sent to pharmacy. -Return to clinic in 6 months or sooner if needed for labs a few days before and office visit.  I discussed the assessment and treatment plan with the patient. The patient was provided an opportunity to ask questions and all were answered. The patient agreed with the plan and demonstrated an understanding of the instructions.   The patient was advised to call back or seek an in-person evaluation if the symptoms worsen or if the condition fails to improve as anticipated.  I spent 20 minutes dedicated to the care of this patient (face-to-face and non-face-to-face) on the date of the encounter to include what is described in the assessment and plan.   Amy Blue, NP

## 2024-05-14 ENCOUNTER — Encounter: Payer: Self-pay | Admitting: Hematology

## 2024-10-26 ENCOUNTER — Ambulatory Visit: Payer: Self-pay | Admitting: Family Medicine

## 2024-10-26 VITALS — BP 144/84 | HR 100 | Temp 98.5°F | Ht 67.0 in | Wt 198.4 lb

## 2024-10-26 DIAGNOSIS — R030 Elevated blood-pressure reading, without diagnosis of hypertension: Secondary | ICD-10-CM | POA: Diagnosis not present

## 2024-10-26 DIAGNOSIS — Z136 Encounter for screening for cardiovascular disorders: Secondary | ICD-10-CM

## 2024-10-26 DIAGNOSIS — G8929 Other chronic pain: Secondary | ICD-10-CM

## 2024-10-26 DIAGNOSIS — K219 Gastro-esophageal reflux disease without esophagitis: Secondary | ICD-10-CM | POA: Diagnosis not present

## 2024-10-26 DIAGNOSIS — Z130 Encounter for screening for diseases of the blood and blood-forming organs and certain disorders involving the immune mechanism: Secondary | ICD-10-CM

## 2024-10-26 DIAGNOSIS — Z1321 Encounter for screening for nutritional disorder: Secondary | ICD-10-CM

## 2024-10-26 DIAGNOSIS — Z8639 Personal history of other endocrine, nutritional and metabolic disease: Secondary | ICD-10-CM

## 2024-10-26 DIAGNOSIS — M25561 Pain in right knee: Secondary | ICD-10-CM

## 2024-10-26 DIAGNOSIS — M25562 Pain in left knee: Secondary | ICD-10-CM

## 2024-10-26 DIAGNOSIS — Z862 Personal history of diseases of the blood and blood-forming organs and certain disorders involving the immune mechanism: Secondary | ICD-10-CM

## 2024-10-26 LAB — LIPID PANEL

## 2024-10-26 LAB — BAYER DCA HB A1C WAIVED: HB A1C (BAYER DCA - WAIVED): 4.9 % (ref 4.8–5.6)

## 2024-10-26 NOTE — Progress Notes (Signed)
 New Patient Office Visit  Patient ID: Amy Andersen, Female   DOB: 1976/11/07 48 y.o. MRN: [id number]  Chief Complaint  Patient presents with   Establish Care   Subjective:     Amy Andersen presents to establish care  HPI  Discussed the use of AI scribe software for clinical note transcription with the patient, who gave verbal consent to proceed.  History of Present Illness   [name] is a 48 year old female who presents to establish care.  Bilateral knee pain and post-surgical status - History of bilateral knee surgeries: right knee two years ago, left knee in December of the previous year per patient - Recent orthopedic sports medicine consultation on October 01, 2024 - Recommendation to defer MRI, consider additional injections if needed, and pursue formal physical therapy - Patient reports that last pcp was prescribing norco for knee pain before he retired. Is requesting pain management referral. Is currently using advil.   Gastroesophageal reflux symptoms - Significant heartburn managed with Protonix for several years - Protonix provides symptomatic relief - No prior evaluation by gastroenterology  Vitamin b12 deficiency and anemia - Requires monthly B12 injections for low levels, managed by oncology with follow-up every six months. Next appointment with oncology scheduled for next month.  - History of iron  deficiency requiring iron  infusions, last infusion administered last year per patient.   Elevated blood pressure - Previously informed of borderline blood pressure - No hx of antihypertensive therapy initiated       Outpatient Encounter Medications as of 10/26/2024  Medication Sig   cyanocobalamin  (VITAMIN B12) 1000 MCG/ML injection Inject 1 mL (1,000 mcg total) into the muscle every 30 (thirty) days.   HYDROcodone -acetaminophen  (NORCO) 10-325 MG tablet Take 1 tablet by mouth every 6 (six) hours as needed. (Patient not taking: Reported on  10/26/2024)   pantoprazole (PROTONIX) 40 MG tablet Take 40 mg by mouth daily.   [DISCONTINUED] famotidine (PEPCID) 40 MG tablet Take 40 mg by mouth 2 (two) times daily.   [DISCONTINUED] omeprazole (PRILOSEC) 40 MG capsule Take 40 mg by mouth 2 (two) times daily.   [DISCONTINUED] Vitamin D , Ergocalciferol , (DRISDOL ) 1.25 MG (50000 UNIT) CAPS capsule Take 1 capsule (50,000 Units total) by mouth every 14 (fourteen) days.   No facility-administered encounter medications on file as of 10/26/2024.    Past Medical History:  Diagnosis Date   Anemia    Chronic pain     Past Surgical History:  Procedure Laterality Date   DILATION AND CURETTAGE OF UTERUS      Family History  Problem Relation Age of Onset   Hypertension Maternal Grandmother    CAD Maternal Grandmother    Cancer Maternal Grandfather    Hypertension Mother     Social History   Socioeconomic History   Marital status: Married    Spouse name: Lorrene   Number of children: 3   Years of education: 9   Highest education level: 9th grade  Occupational History   Occupation: arts development officer   Tobacco Use   Smoking status: Every Day    Current packs/day: 0.50    Types: Cigarettes   Smokeless tobacco: Never  Vaping Use   Vaping status: Never Used  Substance and Sexual Activity   Alcohol use: No   Drug use: No   Sexual activity: Yes    Birth control/protection: None  Other Topics Concern   Not on file  Social History Narrative   Not on file   Social  Drivers of Health   Financial Resource Strain: Low Risk  (04/10/2020)   Overall Financial Resource Strain (CARDIA)    Difficulty of Paying Living Expenses: Not very hard  Food Insecurity: No Food Insecurity (04/10/2020)   Hunger Vital Sign    Worried About Running Out of Food in the Last Year: Never true    Ran Out of Food in the Last Year: Never true  Transportation Needs: No Transportation Needs (04/10/2020)   PRAPARE - Administrator, Civil Service (Medical):  No    Lack of Transportation (Non-Medical): No  Physical Activity: Insufficiently Active (04/10/2020)   Exercise Vital Sign    Days of Exercise per Week: 4 days    Minutes of Exercise per Session: 30 min  Stress: No Stress Concern Present (04/10/2020)   [employer] of Occupational Health - Occupational Stress Questionnaire    Feeling of Stress : Not at all  Social Connections: Moderately Integrated (04/10/2020)   Social Connection and Isolation Panel    Frequency of Communication with Friends and Family: Three times a week    Frequency of Social Gatherings with Friends and Family: Once a week    Attends Religious Services: 1 to 4 times per year    Active Member of [employer] or Organizations: No    Attends Banker Meetings: Never    Marital Status: Married  Catering Manager Violence: Not At Risk (04/10/2020)   Humiliation, Afraid, Rape, and Kick questionnaire    Fear of Current or Ex-Partner: No    Emotionally Abused: No    Physically Abused: No    Sexually Abused: No    ROS    Objective:    BP (!) 144/84 (BP Location: Right Arm)   Pulse 100   Temp 98.5 F (36.9 C)   Ht 5' 7 (1.702 m)   Wt 198 lb 6.4 oz (90 kg)   SpO2 99%   BMI 31.07 kg/m   Physical Exam Vitals reviewed.  Constitutional:      Appearance: Normal appearance.  HENT:     Head: Normocephalic and atraumatic.  Eyes:     Extraocular Movements: Extraocular movements intact.     Conjunctiva/sclera: Conjunctivae normal.     Pupils: Pupils are equal, round, and reactive to light.  Cardiovascular:     Rate and Rhythm: Normal rate and regular rhythm.     Pulses: Normal pulses.     Heart sounds: Normal heart sounds. No murmur heard. Pulmonary:     Effort: Pulmonary effort is normal.     Breath sounds: Normal breath sounds.  Musculoskeletal:        General: No deformity. Normal range of motion.     Cervical back: Normal range of motion.  Skin:    General: Skin is warm and dry.     Capillary  Refill: Capillary refill takes less than 2 seconds.  Neurological:     General: No focal deficit present.     Mental Status: She is alert and oriented to person, place, and time.  Psychiatric:        Mood and Affect: Mood normal.        Behavior: Behavior normal.          Assessment & Plan:   Problem List Items Addressed This Visit   None Visit Diagnoses       Elevated blood pressure reading    -  Primary   Relevant Orders   Microalbumin/Creatinine Ratio, Urine     Chronic pain  of both knees       Relevant Orders   Ambulatory referral to Pain Clinic     Gastroesophageal reflux disease, unspecified whether esophagitis present       Relevant Orders   Ambulatory referral to Gastroenterology     Screening for endocrine, nutritional, metabolic and immunity disorder       Relevant Orders   Comprehensive metabolic panel with GFR   Bayer DCA Hb A1c Waived   CBC with Differential   Microalbumin/Creatinine Ratio, Urine     Encounter for screening for cardiovascular disorders       Relevant Orders   Lipid Panel     History of vitamin D  deficiency       Relevant Orders   Vitamin D , 25-hydroxy     History of anemia due to vitamin B12 deficiency           Assessment and Plan    Gastroesophageal reflux disease Chronic GERD managed with Protonix effectively. No GI consultation history. - Continue Protonix. - Placed referral to GI for further evaluation due to longstanding PPI use with no formal evaluation by GI.   Vitamin B12 deficiency  Managed with monthly B12 injections under oncology supervision.  - Continue monthly B12 injections. - Follow up with oncology in next month. Will let specialist recheck b12 at their visit.   Iron  deficiency anemia, history of History of iron  deficiency anemia - Follow up with oncology next month.   Elevated blood pressure, without diagnosis of hypertension Elevated blood pressure noted today. Previously borderline without  intervention. - Patient reports having BP machine at home. Instructed to check BP each day for the next 5 days and to report back with results. Discussed proper BP management technique. - Will plan to initiate BP medication if readings remain elevated.   Status post bilateral knee surgeries and chronic pain Orthopedic consultation recommended PT and possible MRI or injections. - Placed referral to pain management per request to possibly restart norco  General Health Maintenance Normal Pap smear last year. OBGYN care managed by OBGYN. - Continue routine f/u with OBGYN.      Routine lab work obtained today, results pending  Return in about 2 months (around 12/26/2024) for Annual physical.   Oneil LELON Severin, FNP  [facility]

## 2024-10-27 ENCOUNTER — Other Ambulatory Visit: Payer: Self-pay | Admitting: Family Medicine

## 2024-10-27 ENCOUNTER — Ambulatory Visit: Payer: Self-pay | Admitting: Family Medicine

## 2024-10-27 DIAGNOSIS — E7800 Pure hypercholesterolemia, unspecified: Secondary | ICD-10-CM

## 2024-10-27 LAB — CBC WITH DIFFERENTIAL/PLATELET
Basophils Absolute: 0.1 x10E3/uL (ref 0.0–0.2)
Basos: 1 %
EOS (ABSOLUTE): 0.1 x10E3/uL (ref 0.0–0.4)
Eos: 1 %
Hematocrit: 50.8 % — ABNORMAL HIGH (ref 34.0–46.6)
Hemoglobin: 17 g/dL — ABNORMAL HIGH (ref 11.1–15.9)
Immature Grans (Abs): 0 x10E3/uL (ref 0.0–0.1)
Immature Granulocytes: 0 %
Lymphocytes Absolute: 2.5 x10E3/uL (ref 0.7–3.1)
Lymphs: 27 %
MCH: 31 pg (ref 26.6–33.0)
MCHC: 33.5 g/dL (ref 31.5–35.7)
MCV: 93 fL (ref 79–97)
Monocytes Absolute: 0.4 x10E3/uL (ref 0.1–0.9)
Monocytes: 5 %
Neutrophils Absolute: 6 x10E3/uL (ref 1.4–7.0)
Neutrophils: 66 %
Platelets: 323 x10E3/uL (ref 150–450)
RBC: 5.48 x10E6/uL — ABNORMAL HIGH (ref 3.77–5.28)
RDW: 12.3 % (ref 11.7–15.4)
WBC: 9.1 x10E3/uL (ref 3.4–10.8)

## 2024-10-27 LAB — COMPREHENSIVE METABOLIC PANEL WITH GFR
ALT: 15 IU/L (ref 0–32)
AST: 14 IU/L (ref 0–40)
Albumin: 4.7 g/dL (ref 3.9–4.9)
Alkaline Phosphatase: 105 IU/L (ref 41–116)
BUN/Creatinine Ratio: 8 — AB (ref 9–23)
BUN: 7 mg/dL (ref 6–24)
Bilirubin Total: 0.4 mg/dL (ref 0.0–1.2)
CO2: 25 mmol/L (ref 20–29)
Calcium: 10.1 mg/dL (ref 8.7–10.2)
Chloride: 99 mmol/L (ref 96–106)
Creatinine, Ser: 0.83 mg/dL (ref 0.57–1.00)
Globulin, Total: 3.1 g/dL (ref 1.5–4.5)
Glucose: 87 mg/dL (ref 70–99)
Potassium: 4.5 mmol/L (ref 3.5–5.2)
Sodium: 139 mmol/L (ref 134–144)
Total Protein: 7.8 g/dL (ref 6.0–8.5)
eGFR: 87 mL/min/1.73 (ref >59)

## 2024-10-27 LAB — MICROALBUMIN / CREATININE URINE RATIO
Creatinine, Urine: 127.6 mg/dL
Microalb/Creat Ratio: 4 mg/g{creat} (ref 0–29)
Microalbumin, Urine: 4.6 ug/mL

## 2024-10-27 LAB — LIPID PANEL
Cholesterol, Total: 260 mg/dL — AB (ref 100–199)
HDL: 49 mg/dL (ref >39)
LDL CALC COMMENT:: 5.3 ratio — AB (ref 0.0–4.4)
LDL Chol Calc (NIH): 184 mg/dL — AB (ref 0–99)
Triglycerides: 145 mg/dL (ref 0–149)
VLDL Cholesterol Cal: 27 mg/dL (ref 5–40)

## 2024-10-27 LAB — VITAMIN D 25 HYDROXY (VIT D DEFICIENCY, FRACTURES): Vit D, 25-Hydroxy: 17.7 ng/mL — AB (ref 30.0–100.0)

## 2024-10-27 MED ORDER — ATORVASTATIN CALCIUM 10 MG PO TABS: 10.0000 mg | ORAL_TABLET | Freq: Every day | ORAL | 0 refills | Status: DC

## 2024-11-19 ENCOUNTER — Inpatient Hospital Stay: Attending: Oncology

## 2024-11-19 DIAGNOSIS — D751 Secondary polycythemia: Secondary | ICD-10-CM | POA: Insufficient documentation

## 2024-11-19 DIAGNOSIS — E559 Vitamin D deficiency, unspecified: Secondary | ICD-10-CM | POA: Insufficient documentation

## 2024-11-19 DIAGNOSIS — F17210 Nicotine dependence, cigarettes, uncomplicated: Secondary | ICD-10-CM | POA: Insufficient documentation

## 2024-11-19 DIAGNOSIS — D500 Iron deficiency anemia secondary to blood loss (chronic): Secondary | ICD-10-CM | POA: Insufficient documentation

## 2024-11-19 DIAGNOSIS — E538 Deficiency of other specified B group vitamins: Secondary | ICD-10-CM | POA: Insufficient documentation

## 2024-11-19 LAB — COMPREHENSIVE METABOLIC PANEL WITH GFR
ALT: 11 U/L (ref 0–44)
AST: 16 U/L (ref 15–41)
Albumin: 4.3 g/dL (ref 3.5–5.0)
Alkaline Phosphatase: 88 U/L (ref 38–126)
Anion gap: 4 — ABNORMAL LOW (ref 5–15)
BUN: 11 mg/dL (ref 6–20)
CO2: 33 mmol/L — ABNORMAL HIGH (ref 22–32)
Calcium: 9.6 mg/dL (ref 8.9–10.3)
Chloride: 104 mmol/L (ref 98–111)
Creatinine, Ser: 0.64 mg/dL (ref 0.44–1.00)
GFR, Estimated: >60 mL/min (ref >60)
Glucose, Bld: 74 mg/dL (ref 70–99)
Potassium: 3.9 mmol/L (ref 3.5–5.1)
Sodium: 142 mmol/L (ref 135–145)
Total Bilirubin: 0.3 mg/dL (ref 0.0–1.2)
Total Protein: 7.1 g/dL (ref 6.5–8.1)

## 2024-11-19 LAB — CBC WITH DIFFERENTIAL/PLATELET
Abs Immature Granulocytes: 0.02 K/uL (ref 0.00–0.07)
Basophils Absolute: 0.1 K/uL (ref 0.0–0.1)
Basophils Relative: 1 %
Eosinophils Absolute: 0.3 K/uL (ref 0.0–0.5)
Eosinophils Relative: 4 %
HCT: 49.6 % — ABNORMAL HIGH (ref 36.0–46.0)
Hemoglobin: 16.5 g/dL — ABNORMAL HIGH (ref 12.0–15.0)
Immature Granulocytes: 0 %
Lymphocytes Relative: 33 %
Lymphs Abs: 2.4 K/uL (ref 0.7–4.0)
MCH: 31.2 pg (ref 26.0–34.0)
MCHC: 33.3 g/dL (ref 30.0–36.0)
MCV: 93.8 fL (ref 80.0–100.0)
Monocytes Absolute: 0.4 K/uL (ref 0.1–1.0)
Monocytes Relative: 5 %
Neutro Abs: 4.2 K/uL (ref 1.7–7.7)
Neutrophils Relative %: 57 %
Platelets: 273 K/uL (ref 150–400)
RBC: 5.29 MIL/uL — ABNORMAL HIGH (ref 3.87–5.11)
RDW: 12.4 % (ref 11.5–15.5)
WBC: 7.2 K/uL (ref 4.0–10.5)
nRBC: 0 % (ref 0.0–0.2)

## 2024-11-19 LAB — IRON AND TIBC
Iron: 83 ug/dL (ref 28–170)
Saturation Ratios: 26 % (ref 10.4–31.8)
TIBC: 319 ug/dL (ref 250–450)
UIBC: 236 ug/dL

## 2024-11-19 LAB — VITAMIN B12: Vitamin B-12: 346 pg/mL (ref 180–914)

## 2024-11-19 LAB — FERRITIN: Ferritin: 184 ng/mL (ref 11–307)

## 2024-11-26 ENCOUNTER — Inpatient Hospital Stay: Admitting: Oncology

## 2024-11-26 DIAGNOSIS — D500 Iron deficiency anemia secondary to blood loss (chronic): Secondary | ICD-10-CM

## 2024-11-26 DIAGNOSIS — E559 Vitamin D deficiency, unspecified: Secondary | ICD-10-CM | POA: Insufficient documentation

## 2024-11-26 DIAGNOSIS — E538 Deficiency of other specified B group vitamins: Secondary | ICD-10-CM | POA: Diagnosis not present

## 2024-11-26 DIAGNOSIS — D751 Secondary polycythemia: Secondary | ICD-10-CM | POA: Diagnosis not present

## 2024-11-26 MED ORDER — ERGOCALCIFEROL 1.25 MG (50000 UT) PO CAPS: 50000.0000 [IU] | ORAL_CAPSULE | ORAL | 1 refills | Status: AC

## 2024-11-26 MED ORDER — CYANOCOBALAMIN 1000 MCG/ML IJ SOLN: 1000.0000 ug | INTRAMUSCULAR | 0 refills | Status: AC

## 2024-11-26 NOTE — Assessment & Plan Note (Addendum)
-  She has been receiving monthly B12 injections since May 2024. -Labs from 11/19/2024 show a vitamin B12 level of 346 (295). - Continue self administration of B12 injections but would like to increase this to every other week.

## 2024-11-26 NOTE — Progress Notes (Signed)
 [facility] OFFICE PROGRESS NOTE  Alcus Oneil ORN, FNP  ASSESSMENT & PLAN:   I connected with Bless Lisenby Dohmen on 11/26/2024 at 10:30 AM EST by telephone visit and verified that I am speaking with the correct person using two identifiers.   I discussed the limitations, risks, security and privacy concerns of performing an evaluation and management service by telemedicine and the availability of in-person appointments. I also discussed with the patient that there may be a patient responsible charge related to this service. The patient expressed understanding and agreed to proceed.   Other persons participating in the visit and their role in the encounter: NP, Patient    Patients location: Home  Providers location: Clinic Assessment & Plan Iron  deficiency anemia due to chronic blood loss -Initially found to have a hemoglobin of 6.8 back in March 2019. -She is unable to tolerate oral iron  secondary to GI upset. -She last received IV Venofer  on 10/19/2021. -Most recent labs show ferritin of 184, iron  saturation 26% with a normal TIBC.  Hemoglobin is 16.5. -No additional IV iron  needed at this time.   Erythrocytosis -Likely secondary to smoking.  JAK2 negative.  Erythropoietin  normal.  Elevated carbon monoxide 8.7%. -Most recent hemoglobin was 16.5 with hematocrit of 49.6.   -She is symptomatic with occasional aquagenic pruritus, headaches, dizziness, and intermittent blurry vision. She denies any erythromelalgia, Raynaud's phenomenon, or other vasomotor symptoms.  -No indication for phlebotomy at this time.  Discussed that phlebotomy would be indicated in the setting of HCT >54.0 for severe vasomotor symptoms. - No indication for aspirin in the absence of any other cardiac risk factors. -We discussed if she continues to have severe fatigue could consider phlebotomy in 3 months.  First, we will increase her B12 injections from monthly to every other week and get her back  on her vitamin D  weekly supplements.  B12 deficiency -She has been receiving monthly B12 injections since May 2024. -Labs from 11/19/2024 show a vitamin B12 level of 346 (295). - Continue self administration of B12 injections but would like to increase this to every other week.   Vitamin D  deficiency -Currently on vitamin D  and calcium  tablets. -Most recent vitamin D  level from 10/26/2024 showed a vitamin D  level of 17.7. -I recommend ergocalciferol  50,000 units weekly and recheck labs in 6 months. -A new prescription was sent to her pharmacy.  Orders Placed This Encounter  Procedures   Ferritin    Standing Status:   Future    Expected Date:   06/03/2025    Expiration Date:   11/26/2025   Iron  and TIBC ([facility] ONLY)    Standing Status:   Future    Expected Date:   06/03/2025    Expiration Date:   11/26/2025   Vitamin B12    Standing Status:   Future    Expected Date:   06/03/2025    Expiration Date:   11/26/2025   Methylmalonic acid, serum    Standing Status:   Future    Expected Date:   06/03/2025    Expiration Date:   11/26/2025   Comprehensive metabolic panel    Standing Status:   Future    Expected Date:   06/03/2025    Expiration Date:   11/26/2025   CBC with Differential    Standing Status:   Future    Expected Date:   06/03/2025    Expiration Date:   11/26/2025    INTERVAL HISTORY: Mrs. Mednick is a  48 year old female with past medical history significant for iron  deficiency anemia receiving intermittent IV iron .  She also has past medical history significant for erythrocytosis, B12 deficiency and vitamin D  deficiency.   Patient has appetite of 70% energy levels are 40%.  Reports pain in her knees 6 out of 10.  Has dizziness at times tingling in her feet and trouble falling and staying asleep.   In the interim, she denies any hospitalizations, surgeries or changes to her baseline health.   She last received IV Venofer  on 10/19/2021.  She is  currently giving herself B12 shots monthly.   She denies any melena, hematochezia or rectal bleeding.  She reports her menstrual cycles are irregular but fairly light.  She has chronic pica cravings for ice.   She continues to smoke 0.5 packs/day.  She has occasional aquagenic pruritus and intermittent blurry vision.  Reports headaches and intermittent dizziness.  Denies any erythromelalgia, Raynaud's phenomenon or other vasomotor symptoms.   Denies any interval hospitalizations, surgeries or changes to her baseline health.  We reviewed CBC, CMP, vitamin B, MMA, iron  panel and ferritin.  SUMMARY OF HEMATOLOGIC HISTORY: Oncology History   No problem history exists.     CBC    Component Value Date/Time   WBC 7.2 11/19/2024 1011   RBC 5.29 (H) 11/19/2024 1011   HGB 16.5 (H) 11/19/2024 1011   HGB 17.0 (H) 10/26/2024 1119   HCT 49.6 (H) 11/19/2024 1011   HCT 50.8 (H) 10/26/2024 1119   PLT 273 11/19/2024 1011   PLT 323 10/26/2024 1119   MCV 93.8 11/19/2024 1011   MCV 93 10/26/2024 1119   MCH 31.2 11/19/2024 1011   MCHC 33.3 11/19/2024 1011   RDW 12.4 11/19/2024 1011   RDW 12.3 10/26/2024 1119   LYMPHSABS 2.4 11/19/2024 1011   LYMPHSABS 2.5 10/26/2024 1119   MONOABS 0.4 11/19/2024 1011   EOSABS 0.3 11/19/2024 1011   EOSABS 0.1 10/26/2024 1119   BASOSABS 0.1 11/19/2024 1011   BASOSABS 0.1 10/26/2024 1119       Latest Ref Rng & Units 11/19/2024   10:11 AM 10/26/2024   11:19 AM 04/30/2024   10:31 AM  CMP  Glucose 70 - 99 mg/dL 74  87  92   BUN 6 - 20 mg/dL 11  7  10    Creatinine 0.44 - 1.00 mg/dL 9.35  9.16  9.26   Sodium 135 - 145 mmol/L 142  139  136   Potassium 3.5 - 5.1 mmol/L 3.9  4.5  3.9   Chloride 98 - 111 mmol/L 104  99  99   CO2 22 - 32 mmol/L 33  25  28   Calcium  8.9 - 10.3 mg/dL 9.6  89.8  9.2   Total Protein 6.5 - 8.1 g/dL 7.1  7.8  7.2   Total Bilirubin 0.0 - 1.2 mg/dL 0.3  0.4  0.5   Alkaline Phos 38 - 126 U/L 88  105  66   AST 15 - 41 U/L 16  14  18     ALT 0 - 44 U/L 11  15  23       Lab Results  Component Value Date   FERRITIN 184 11/19/2024   VITAMINB12 346 11/19/2024    There were no vitals filed for this visit.  Review of System:  Review of Systems  Musculoskeletal:  Positive for joint pain.  Neurological:  Positive for dizziness, tingling and sensory change.    Physical Exam: Physical Exam Neurological:  Mental Status: She is alert and oriented to person, place, and time.      I provided 20 minutes of non face-to-face telephone visit time during this encounter, and > 50% was spent counseling as documented under my assessment & plan.   Delon Hope, NP 11/26/2024 9:16 AM

## 2024-11-26 NOTE — Assessment & Plan Note (Addendum)
-  Likely secondary to smoking.  JAK2 negative.  Erythropoietin  normal.  Elevated carbon monoxide 8.7%. -Most recent hemoglobin was 16.5 with hematocrit of 49.6.   -She is symptomatic with occasional aquagenic pruritus, headaches, dizziness, and intermittent blurry vision. She denies any erythromelalgia, Raynaud's phenomenon, or other vasomotor symptoms.  -No indication for phlebotomy at this time.  Discussed that phlebotomy would be indicated in the setting of HCT >54.0 for severe vasomotor symptoms. - No indication for aspirin in the absence of any other cardiac risk factors. -We discussed if she continues to have severe fatigue could consider phlebotomy in 3 months.  First, we will increase her B12 injections from monthly to every other week and get her back on her vitamin D  weekly supplements.

## 2024-11-26 NOTE — Assessment & Plan Note (Addendum)
-  Currently on vitamin D  and calcium  tablets. -Most recent vitamin D  level from 10/26/2024 showed a vitamin D  level of 17.7. -I recommend ergocalciferol  50,000 units weekly and recheck labs in 6 months. -A new prescription was sent to her pharmacy.

## 2024-11-26 NOTE — Assessment & Plan Note (Addendum)
-  Initially found to have a hemoglobin of 6.8 back in March 2019. -She is unable to tolerate oral iron  secondary to GI upset. -She last received IV Venofer  on 10/19/2021. -Most recent labs show ferritin of 184, iron  saturation 26% with a normal TIBC.  Hemoglobin is 16.5. -No additional IV iron  needed at this time.

## 2024-11-27 LAB — METHYLMALONIC ACID, SERUM: Methylmalonic Acid, Quantitative: 257 nmol/L (ref 0–378)

## 2024-11-29 ENCOUNTER — Encounter: Payer: Self-pay | Admitting: Internal Medicine

## 2024-12-13 ENCOUNTER — Encounter: Payer: Self-pay | Admitting: *Deleted

## 2024-12-20 ENCOUNTER — Telehealth (INDEPENDENT_AMBULATORY_CARE_PROVIDER_SITE_OTHER): Payer: Self-pay

## 2024-12-20 ENCOUNTER — Ambulatory Visit (INDEPENDENT_AMBULATORY_CARE_PROVIDER_SITE_OTHER): Admitting: Internal Medicine

## 2024-12-20 ENCOUNTER — Encounter: Payer: Self-pay | Admitting: Internal Medicine

## 2024-12-20 VITALS — BP 127/84 | HR 101 | Temp 97.3°F | Ht 67.0 in | Wt 197.0 lb

## 2024-12-20 DIAGNOSIS — R1310 Dysphagia, unspecified: Secondary | ICD-10-CM

## 2024-12-20 DIAGNOSIS — K219 Gastro-esophageal reflux disease without esophagitis: Secondary | ICD-10-CM | POA: Diagnosis not present

## 2024-12-20 DIAGNOSIS — Z1212 Encounter for screening for malignant neoplasm of rectum: Secondary | ICD-10-CM

## 2024-12-20 DIAGNOSIS — R1319 Other dysphagia: Secondary | ICD-10-CM

## 2024-12-20 MED ORDER — PANTOPRAZOLE SODIUM 40 MG PO TBEC: 40.0000 mg | DELAYED_RELEASE_TABLET | Freq: Two times a day (BID) | ORAL | 11 refills | Status: AC

## 2024-12-20 MED ORDER — PEG 3350-KCL-NA BICARB-NACL 420 G PO SOLR: 4000.0000 mL | Freq: Once | ORAL | 0 refills | Status: AC

## 2024-12-20 NOTE — Telephone Encounter (Signed)
 Spoke with patient in the office, scheduled TCS/EGD/DIL for 12/24/2024 at 2p, rx sent to pharmacy. Instructions given to patient.

## 2024-12-20 NOTE — Progress Notes (Signed)
 "   Primary Care Physician:  Alcus Oneil ORN, FNP Primary Gastroenterologist:  Dr. Cindie  Chief Complaint  Patient presents with   Gastroesophageal Reflux    Pt arrives due to GERD. Pt has taking Protonix  and that did help. Has also tried Pepcid and Nexium, no improvement. Has not had any Protonix  since October. Is taking OTC chewable Rolaids prn.     HPI:   Amy Andersen is a 49 y.o. female who presents to the clinic today by referral from her PCP Oneil Alcus for evaluation.  Chronic GERD, esophageal dysphagia: States she has had reflux for many years.  Previously on esomeprazole, most recently pantoprazole  although has been out of this medication since October 2025 as her PCP retired.  Currently taking Rolaids as needed and OTC esomeprazole.  Having breakthrough symptoms of heartburn and acid reflux.  Symptoms occur daily.    Also notes progressively worsening esophageal dysphagia.  Feels like food will occasionally get stuck in her esophagus.  No food regurgitation.  No prior upper endoscopy.  Colon cancer screening: Reports she did stool cards 2 to 3 years ago which were negative.  Denies any family history of colorectal malignancy.  No melena hematochezia.  No abdominal pain or unintentional weight loss.  Past Medical History:  Diagnosis Date   Anemia    Chronic pain     Past Surgical History:  Procedure Laterality Date   DILATION AND CURETTAGE OF UTERUS      Current Outpatient Medications  Medication Sig Dispense Refill   cyanocobalamin  (VITAMIN B12) 1000 MCG/ML injection Inject 1 mL (1,000 mcg total) into the muscle every 14 (fourteen) days. 12 mL 0   ergocalciferol  (VITAMIN D2) 1.25 MG (50000 UT) capsule Take 1 capsule (50,000 Units total) by mouth once a week. 12 capsule 1   pantoprazole  (PROTONIX ) 40 MG tablet Take 40 mg by mouth daily. (Patient not taking: Reported on 12/20/2024)     No current facility-administered medications for this visit.    Allergies as  of 12/20/2024   (No Known Allergies)    Family History  Problem Relation Age of Onset   Hypertension Maternal Grandmother    CAD Maternal Grandmother    Cancer Maternal Grandfather    Hypertension Mother     Social History   Socioeconomic History   Marital status: Married    Spouse name: Lorrene   Number of children: 3   Years of education: 9   Highest education level: 9th grade  Occupational History   Occupation: arts development officer   Tobacco Use   Smoking status: Every Day    Current packs/day: 0.50    Types: Cigarettes   Smokeless tobacco: Never  Vaping Use   Vaping status: Never Used  Substance and Sexual Activity   Alcohol use: No   Drug use: No   Sexual activity: Yes    Birth control/protection: None  Other Topics Concern   Not on file  Social History Narrative   Not on file   Social Drivers of Health   Tobacco Use: High Risk (12/20/2024)   Patient History    Smoking Tobacco Use: Every Day    Smokeless Tobacco Use: Never    Passive Exposure: Not on file  Financial Resource Strain: Not on file  Food Insecurity: Not on file  Transportation Needs: Not on file  Physical Activity: Not on file  Stress: Not on file  Social Connections: Not on file  Intimate Partner Violence: Not on file  Depression (EYV7-0): Low  Risk (11/26/2024)   Depression (PHQ2-9)    PHQ-2 Score: 0  Alcohol Screen: Not on file  Housing: Not on file  Utilities: Not on file  Health Literacy: Not on file    Subjective: Review of Systems  Constitutional:  Negative for chills and fever.  HENT:  Negative for congestion and hearing loss.   Eyes:  Negative for blurred vision and double vision.  Respiratory:  Negative for cough and shortness of breath.   Cardiovascular:  Negative for chest pain and palpitations.  Gastrointestinal:  Positive for heartburn. Negative for abdominal pain, blood in stool, constipation, diarrhea, melena and vomiting.       Dysphagia  Genitourinary:  Negative for dysuria  and urgency.  Musculoskeletal:  Negative for joint pain and myalgias.  Skin:  Negative for itching and rash.  Neurological:  Negative for dizziness and headaches.  Psychiatric/Behavioral:  Negative for depression. The patient is not nervous/anxious.        Objective: BP 127/84   Pulse (!) 101   Temp (!) 97.3 F (36.3 C)   Ht 5' 7 (1.702 m)   Wt 197 lb (89.4 kg)   BMI 30.85 kg/m  Physical Exam Constitutional:      Appearance: Normal appearance.  HENT:     Head: Normocephalic and atraumatic.  Eyes:     Extraocular Movements: Extraocular movements intact.     Conjunctiva/sclera: Conjunctivae normal.  Cardiovascular:     Rate and Rhythm: Normal rate and regular rhythm.  Pulmonary:     Effort: Pulmonary effort is normal.     Breath sounds: Normal breath sounds.  Abdominal:     General: Bowel sounds are normal.     Palpations: Abdomen is soft.  Musculoskeletal:        General: No swelling. Normal range of motion.     Cervical back: Normal range of motion and neck supple.  Skin:    General: Skin is warm and dry.     Coloration: Skin is not jaundiced.  Neurological:     General: No focal deficit present.     Mental Status: She is alert and oriented to person, place, and time.  Psychiatric:        Mood and Affect: Mood normal.        Behavior: Behavior normal.      Assessment/Plan:  1.  Chronic GERD, esophageal dysphagia- Will schedule for EGD to evaluate for peptic ulcer disease, esophagitis, gastritis, H. Pylori, duodenitis, or other. Will also evaluate for esophageal stricture, Schatzki's ring, esophageal web or other.   Will restart on pantoprazole  and increase dosage to 40 mg twice daily.  Consider trial of Voquezna if not improved.   2.  Colon cancer screening-at same time as upper endoscopy, we will perform colonoscopy for colon cancer screening purposes.  The risks of procedures including infection, bleed, or perforation as well as benefits, limitations,  alternatives and imponderables have been reviewed with the patient. Potential for esophageal dilation, biopsy, etc. have also been reviewed.  Questions have been answered. All parties agreeable.  Thank you Oneil Severin for the kind referral.  12/20/2024 8:58 AM    "

## 2024-12-20 NOTE — Patient Instructions (Signed)
 We will schedule you for upper endoscopy to further evaluate your acid reflux.  I may elect to stretch your esophagus depending on findings.  At the same time we will perform colonoscopy for colon cancer screening purposes.  In the meantime I am going to restart you on pantoprazole  but increase dosage to 40 mg twice a day.  It was very nice seeing you today.  Dr. Cindie

## 2024-12-20 NOTE — Telephone Encounter (Signed)
 Prior shara is not required according to [facility].

## 2024-12-20 NOTE — H&P (View-Only) (Signed)
 "   Primary Care Physician:  Alcus Oneil ORN, FNP Primary Gastroenterologist:  Dr. Cindie  Chief Complaint  Patient presents with   Gastroesophageal Reflux    Pt arrives due to GERD. Pt has taking Protonix  and that did help. Has also tried Pepcid and Nexium, no improvement. Has not had any Protonix  since October. Is taking OTC chewable Rolaids prn.     HPI:   Amy Andersen is a 49 y.o. female who presents to the clinic today by referral from her PCP Oneil Alcus for evaluation.  Chronic GERD, esophageal dysphagia: States she has had reflux for many years.  Previously on esomeprazole, most recently pantoprazole  although has been out of this medication since October 2025 as her PCP retired.  Currently taking Rolaids as needed and OTC esomeprazole.  Having breakthrough symptoms of heartburn and acid reflux.  Symptoms occur daily.    Also notes progressively worsening esophageal dysphagia.  Feels like food will occasionally get stuck in her esophagus.  No food regurgitation.  No prior upper endoscopy.  Colon cancer screening: Reports she did stool cards 2 to 3 years ago which were negative.  Denies any family history of colorectal malignancy.  No melena hematochezia.  No abdominal pain or unintentional weight loss.  Past Medical History:  Diagnosis Date   Anemia    Chronic pain     Past Surgical History:  Procedure Laterality Date   DILATION AND CURETTAGE OF UTERUS      Current Outpatient Medications  Medication Sig Dispense Refill   cyanocobalamin  (VITAMIN B12) 1000 MCG/ML injection Inject 1 mL (1,000 mcg total) into the muscle every 14 (fourteen) days. 12 mL 0   ergocalciferol  (VITAMIN D2) 1.25 MG (50000 UT) capsule Take 1 capsule (50,000 Units total) by mouth once a week. 12 capsule 1   pantoprazole  (PROTONIX ) 40 MG tablet Take 40 mg by mouth daily. (Patient not taking: Reported on 12/20/2024)     No current facility-administered medications for this visit.    Allergies as  of 12/20/2024   (No Known Allergies)    Family History  Problem Relation Age of Onset   Hypertension Maternal Grandmother    CAD Maternal Grandmother    Cancer Maternal Grandfather    Hypertension Mother     Social History   Socioeconomic History   Marital status: Married    Spouse name: Lorrene   Number of children: 3   Years of education: 9   Highest education level: 9th grade  Occupational History   Occupation: arts development officer   Tobacco Use   Smoking status: Every Day    Current packs/day: 0.50    Types: Cigarettes   Smokeless tobacco: Never  Vaping Use   Vaping status: Never Used  Substance and Sexual Activity   Alcohol use: No   Drug use: No   Sexual activity: Yes    Birth control/protection: None  Other Topics Concern   Not on file  Social History Narrative   Not on file   Social Drivers of Health   Tobacco Use: High Risk (12/20/2024)   Patient History    Smoking Tobacco Use: Every Day    Smokeless Tobacco Use: Never    Passive Exposure: Not on file  Financial Resource Strain: Not on file  Food Insecurity: Not on file  Transportation Needs: Not on file  Physical Activity: Not on file  Stress: Not on file  Social Connections: Not on file  Intimate Partner Violence: Not on file  Depression (EYV7-0): Low  Risk (11/26/2024)   Depression (PHQ2-9)    PHQ-2 Score: 0  Alcohol Screen: Not on file  Housing: Not on file  Utilities: Not on file  Health Literacy: Not on file    Subjective: Review of Systems  Constitutional:  Negative for chills and fever.  HENT:  Negative for congestion and hearing loss.   Eyes:  Negative for blurred vision and double vision.  Respiratory:  Negative for cough and shortness of breath.   Cardiovascular:  Negative for chest pain and palpitations.  Gastrointestinal:  Positive for heartburn. Negative for abdominal pain, blood in stool, constipation, diarrhea, melena and vomiting.       Dysphagia  Genitourinary:  Negative for dysuria  and urgency.  Musculoskeletal:  Negative for joint pain and myalgias.  Skin:  Negative for itching and rash.  Neurological:  Negative for dizziness and headaches.  Psychiatric/Behavioral:  Negative for depression. The patient is not nervous/anxious.        Objective: BP 127/84   Pulse (!) 101   Temp (!) 97.3 F (36.3 C)   Ht 5' 7 (1.702 m)   Wt 197 lb (89.4 kg)   BMI 30.85 kg/m  Physical Exam Constitutional:      Appearance: Normal appearance.  HENT:     Head: Normocephalic and atraumatic.  Eyes:     Extraocular Movements: Extraocular movements intact.     Conjunctiva/sclera: Conjunctivae normal.  Cardiovascular:     Rate and Rhythm: Normal rate and regular rhythm.  Pulmonary:     Effort: Pulmonary effort is normal.     Breath sounds: Normal breath sounds.  Abdominal:     General: Bowel sounds are normal.     Palpations: Abdomen is soft.  Musculoskeletal:        General: No swelling. Normal range of motion.     Cervical back: Normal range of motion and neck supple.  Skin:    General: Skin is warm and dry.     Coloration: Skin is not jaundiced.  Neurological:     General: No focal deficit present.     Mental Status: She is alert and oriented to person, place, and time.  Psychiatric:        Mood and Affect: Mood normal.        Behavior: Behavior normal.      Assessment/Plan:  1.  Chronic GERD, esophageal dysphagia- Will schedule for EGD to evaluate for peptic ulcer disease, esophagitis, gastritis, H. Pylori, duodenitis, or other. Will also evaluate for esophageal stricture, Schatzki's ring, esophageal web or other.   Will restart on pantoprazole  and increase dosage to 40 mg twice daily.  Consider trial of Voquezna if not improved.   2.  Colon cancer screening-at same time as upper endoscopy, we will perform colonoscopy for colon cancer screening purposes.  The risks of procedures including infection, bleed, or perforation as well as benefits, limitations,  alternatives and imponderables have been reviewed with the patient. Potential for esophageal dilation, biopsy, etc. have also been reviewed.  Questions have been answered. All parties agreeable.  Thank you Oneil Severin for the kind referral.  12/20/2024 8:58 AM    "

## 2024-12-22 ENCOUNTER — Encounter (HOSPITAL_COMMUNITY)
Admission: RE | Admit: 2024-12-22 | Discharge: 2024-12-22 | Disposition: A | Discharge status: Home / Self Care | Source: Ambulatory Visit | Attending: Internal Medicine | Admitting: Internal Medicine

## 2024-12-22 DIAGNOSIS — Z01818 Encounter for other preprocedural examination: Secondary | ICD-10-CM

## 2024-12-24 ENCOUNTER — Other Ambulatory Visit: Payer: Self-pay

## 2024-12-24 ENCOUNTER — Encounter (HOSPITAL_COMMUNITY): Payer: Self-pay | Admitting: Internal Medicine

## 2024-12-24 ENCOUNTER — Ambulatory Visit (HOSPITAL_COMMUNITY): Admitting: Anesthesiology

## 2024-12-24 ENCOUNTER — Ambulatory Visit (HOSPITAL_COMMUNITY)
Admission: RE | Admit: 2024-12-24 | Discharge: 2024-12-24 | Disposition: A | Discharge status: Home / Self Care | Attending: Internal Medicine | Admitting: Internal Medicine

## 2024-12-24 ENCOUNTER — Encounter (HOSPITAL_COMMUNITY)
Admission: RE | Disposition: A | Discharge status: Home / Self Care | Payer: Self-pay | Source: Home / Self Care | Attending: Internal Medicine

## 2024-12-24 DIAGNOSIS — K219 Gastro-esophageal reflux disease without esophagitis: Secondary | ICD-10-CM | POA: Diagnosis not present

## 2024-12-24 DIAGNOSIS — Z91128 Patient's intentional underdosing of medication regimen for other reason: Secondary | ICD-10-CM | POA: Insufficient documentation

## 2024-12-24 DIAGNOSIS — K2289 Other specified disease of esophagus: Secondary | ICD-10-CM | POA: Diagnosis not present

## 2024-12-24 DIAGNOSIS — K2970 Gastritis, unspecified, without bleeding: Secondary | ICD-10-CM | POA: Diagnosis not present

## 2024-12-24 DIAGNOSIS — F17210 Nicotine dependence, cigarettes, uncomplicated: Secondary | ICD-10-CM | POA: Insufficient documentation

## 2024-12-24 DIAGNOSIS — T471X6A Underdosing of other antacids and anti-gastric-secretion drugs, initial encounter: Secondary | ICD-10-CM | POA: Diagnosis not present

## 2024-12-24 DIAGNOSIS — Z79899 Other long term (current) drug therapy: Secondary | ICD-10-CM | POA: Insufficient documentation

## 2024-12-24 DIAGNOSIS — R1314 Dysphagia, pharyngoesophageal phase: Secondary | ICD-10-CM | POA: Insufficient documentation

## 2024-12-24 DIAGNOSIS — K648 Other hemorrhoids: Secondary | ICD-10-CM | POA: Diagnosis not present

## 2024-12-24 DIAGNOSIS — K621 Rectal polyp: Secondary | ICD-10-CM | POA: Diagnosis not present

## 2024-12-24 DIAGNOSIS — Z01818 Encounter for other preprocedural examination: Secondary | ICD-10-CM

## 2024-12-24 DIAGNOSIS — Z1211 Encounter for screening for malignant neoplasm of colon: Secondary | ICD-10-CM | POA: Insufficient documentation

## 2024-12-24 HISTORY — PX: POLYPECTOMY: SHX149

## 2024-12-24 HISTORY — PX: ESOPHAGEAL DILATION: SHX303

## 2024-12-24 HISTORY — PX: ESOPHAGOGASTRODUODENOSCOPY: SHX5428

## 2024-12-24 HISTORY — PX: COLONOSCOPY: SHX5424

## 2024-12-24 LAB — POCT PREGNANCY, URINE: Preg Test, Ur: NEGATIVE

## 2024-12-24 SURGERY — COLONOSCOPY
Anesthesia: General

## 2024-12-24 MED ORDER — PROPOFOL 500 MG/50ML IV EMUL
INTRAVENOUS | Status: DC | PRN
  Administered 2024-12-24: 150 ug/kg/min via INTRAVENOUS

## 2024-12-24 MED ORDER — LIDOCAINE 2% (20 MG/ML) 5 ML SYRINGE
INTRAMUSCULAR | Status: DC | PRN
  Administered 2024-12-24: 50 mg via INTRAVENOUS

## 2024-12-24 MED ORDER — PROPOFOL 10 MG/ML IV BOLUS
INTRAVENOUS | Status: DC | PRN
  Administered 2024-12-24: 50 mg via INTRAVENOUS
  Administered 2024-12-24: 100 mg via INTRAVENOUS

## 2024-12-24 MED ORDER — LACTATED RINGERS IV SOLN: INTRAVENOUS | Status: DC

## 2024-12-24 NOTE — Op Note (Signed)
 [facility] Patient Name: Amy Andersen Procedure Date: 12/24/2024 10:37 AM MRN: [id number] Date of Birth: [birth year] Attending MD: Carlin POUR. Cindie , [place], [id number] CSN: [id number] Age: 49 Admit Type: Outpatient Procedure:                Colonoscopy Indications:              Screening for colorectal malignant neoplasm Providers:                Carlin POUR. Cindie, DO, Olam Ada, RN, Daphne Mulch                            Technician, Technician Referring MD:              Medicines:                See the Anesthesia note for documentation of the                            administered medications Complications:            No immediate complications. Estimated Blood Loss:     Estimated blood loss was minimal. Procedure:                Pre-Anesthesia Assessment:                           - The anesthesia plan was to use monitored                            anesthesia care (MAC).                           After obtaining informed consent, the colonoscope                            was passed under direct vision. Throughout the                            procedure, the patient's blood pressure, pulse, and                            oxygen saturations were monitored continuously. The                            [id number]-HQ190L ([id number]) Peds Colon was introduced                            through the anus and advanced to the the terminal                            ileum, with identification of the appendiceal                            orifice and IC valve. The colonoscopy was performed                            without difficulty. The  patient tolerated the                            procedure well. The quality of the bowel                            preparation was evaluated using the BBPS [facility]                            Bowel Preparation Scale) with scores of: Right                            Colon = 3, Transverse Colon = 3 and Left Colon = 3                            (entire  mucosa seen well with no residual staining,                            small fragments of stool or opaque liquid). The                            total BBPS score equals 9. Scope In: 11:12:38 AM Scope Out: 11:23:20 AM Scope Withdrawal Time: 0 hours 8 minutes 6 seconds  Total Procedure Duration: 0 hours 10 minutes 42 seconds  Findings:      Non-bleeding internal hemorrhoids were found.      A 5 mm polyp was found in the rectum. The polyp was sessile. The polyp       was removed with a cold snare. Resection and retrieval were complete.      The terminal ileum appeared normal.      The exam was otherwise without abnormality. Impression:               - Non-bleeding internal hemorrhoids.                           - One 5 mm polyp in the rectum, removed with a cold                            snare. Resected and retrieved.                           - The examined portion of the ileum was normal.                           - The examination was otherwise normal. Moderate Sedation:      Per Anesthesia Care Recommendation:           - Patient has a contact number available for                            emergencies. The signs and symptoms of potential                            delayed complications were discussed with the  patient. Return to normal activities tomorrow.                            Written discharge instructions were provided to the                            patient.                           - Resume previous diet.                           - Continue present medications.                           - Await pathology results.                           - Repeat colonoscopy in 7-10 years depending on                            pathology results for surveillance.                           - Return to GI clinic in 3 months. Procedure Code(s):        --- Professional ---                           [phone], Colonoscopy, flexible; with removal of                             tumor(s), polyp(s), or other lesion(s) by snare                            technique Diagnosis Code(s):        --- Professional ---                           Z12.11, Encounter for screening for malignant                            neoplasm of colon                           D12.8, Benign neoplasm of rectum                           K64.8, Other hemorrhoids CPT copyright 2022 [employer]. All rights reserved. The codes documented in this report are preliminary and upon coder review may  be revised to meet current compliance requirements. Carlin POUR. Cindie, DO Carlin POUR. Cindie, DO 12/24/2024 11:25:07 AM This report has been signed electronically. Number of Addenda: 0

## 2024-12-24 NOTE — Op Note (Signed)
 [facility] Patient Name: Amy Andersen Procedure Date: 12/24/2024 10:48 AM MRN: [id number] Date of Birth: 1976-05-20 Attending MD: Carlin POUR. Cindie , [place], [id number] CSN: [id number] Age: 49 Admit Type: Outpatient Procedure:                Upper GI endoscopy Indications:              Dysphagia, Heartburn Providers:                Carlin POUR. Cindie, DO, Olam Ada, RN, Daphne Mulch                            Technician, Technician Referring MD:              Medicines:                See the Anesthesia note for documentation of the                            administered medications Complications:            No immediate complications. Estimated Blood Loss:     Estimated blood loss was minimal. Procedure:                Pre-Anesthesia Assessment:                           - The anesthesia plan was to use monitored                            anesthesia care (MAC).                           After obtaining informed consent, the endoscope was                            passed under direct vision. Throughout the                            procedure, the patient's blood pressure, pulse, and                            oxygen saturations were monitored continuously. The                            [id number] ([id number]) Upper was introduced through                            the mouth, and advanced to the second part of                            duodenum. The upper GI endoscopy was accomplished                            without difficulty. The patient tolerated the                            procedure well. Scope In: 11:04:32  AM Scope Out: 11:07:54 AM Total Procedure Duration: 0 hours 3 minutes 22 seconds  Findings:      The Z-line was irregular and was found 38 cm from the incisors. Biopsies       were taken with a cold forceps for histology.      Patchy mild inflammation was found in the entire examined stomach.       Biopsies were taken with a cold forceps for Helicobacter pylori  testing.      The duodenal bulb, first portion of the duodenum and second portion of       the duodenum were normal. Impression:               - Z-line irregular, 38 cm from the incisors.                            Biopsied.                           - Gastritis. Biopsied.                           - Normal duodenal bulb, first portion of the                            duodenum and second portion of the duodenum. Moderate Sedation:      Per Anesthesia Care Recommendation:           - Patient has a contact number available for                            emergencies. The signs and symptoms of potential                            delayed complications were discussed with the                            patient. Return to normal activities tomorrow.                            Written discharge instructions were provided to the                            patient.                           - Resume previous diet.                           - Continue present medications.                           - Await pathology results.                           - Return to GI clinic in 3 months.                           - Use a  proton pump inhibitor PO BID. Procedure Code(s):        --- Professional ---                           [phone], Esophagogastroduodenoscopy, flexible,                            transoral; with biopsy, single or multiple Diagnosis Code(s):        --- Professional ---                           K22.89, Other specified disease of esophagus                           K29.70, Gastritis, unspecified, without bleeding                           R13.10, Dysphagia, unspecified                           R12, Heartburn CPT copyright 2022 [employer] Association. All rights reserved. The codes documented in this report are preliminary and upon coder review may  be revised to meet current compliance requirements. Carlin POUR. Cindie, DO Carlin POUR. Cindie, DO 12/24/2024 11:09:22 AM This report  has been signed electronically. Number of Addenda: 0

## 2024-12-24 NOTE — Interval H&P Note (Signed)
 History and Physical Interval Note:  12/24/2024 10:18 AM  Amy Andersen  has presented today for surgery, with the diagnosis of screening, gastroesophageal reflux disease, esophageal dysphagia.  The various methods of treatment have been discussed with the patient and family. After consideration of risks, benefits and other options for treatment, the patient has consented to  Procedures with comments: COLONOSCOPY (N/A) - 2:00pm, ASA 2 EGD (ESOPHAGOGASTRODUODENOSCOPY) (N/A) DILATION, ESOPHAGUS (N/A) as a surgical intervention.  The patient's history has been reviewed, patient examined, no change in status, stable for surgery.  I have reviewed the patient's chart and labs.  Questions were answered to the patient's satisfaction.     Carlin MARLA Hasty

## 2024-12-24 NOTE — Discharge Instructions (Addendum)
 EGD Discharge instructions Please read the instructions outlined below and refer to this sheet in the next few weeks. These discharge instructions provide you with general information on caring for yourself after you leave the hospital. Your doctor may also give you specific instructions. While your treatment has been planned according to the most current medical practices available, unavoidable complications occasionally occur. If you have any problems or questions after discharge, please call your doctor. ACTIVITY You may resume your regular activity but move at a slower pace for the next 24 hours.  Take frequent rest periods for the next 24 hours.  Walking will help expel (get rid of) the air and reduce the bloated feeling in your abdomen.  No driving for 24 hours (because of the anesthesia (medicine) used during the test).  You may shower.  Do not sign any important legal documents or operate any machinery for 24 hours (because of the anesthesia used during the test).  NUTRITION Drink plenty of fluids.  You may resume your normal diet.  Begin with a light meal and progress to your normal diet.  Avoid alcoholic beverages for 24 hours or as instructed by your caregiver.  MEDICATIONS You may resume your normal medications unless your caregiver tells you otherwise.  WHAT YOU CAN EXPECT TODAY You may experience abdominal discomfort such as a feeling of fullness or gas pains.  FOLLOW-UP Your doctor will discuss the results of your test with you.  SEEK IMMEDIATE MEDICAL ATTENTION IF ANY OF THE FOLLOWING OCCUR: Excessive nausea (feeling sick to your stomach) and/or vomiting.  Severe abdominal pain and distention (swelling).  Trouble swallowing.  Temperature over 101 F (37.8 C).  Rectal bleeding or vomiting of blood.      Colonoscopy Discharge Instructions  Read the instructions outlined below and refer to this sheet in the next few weeks. These discharge instructions provide you  with general information on caring for yourself after you leave the hospital. Your doctor may also give you specific instructions. While your treatment has been planned according to the most current medical practices available, unavoidable complications occasionally occur.   ACTIVITY You may resume your regular activity, but move at a slower pace for the next 24 hours.  Take frequent rest periods for the next 24 hours.  Walking will help get rid of the air and reduce the bloated feeling in your belly (abdomen).  No driving for 24 hours (because of the medicine (anesthesia) used during the test).   Do not sign any important legal documents or operate any machinery for 24 hours (because of the anesthesia used during the test).  NUTRITION Drink plenty of fluids.  You may resume your normal diet as instructed by your doctor.  Begin with a light meal and progress to your normal diet. Heavy or fried foods are harder to digest and may make you feel sick to your stomach (nauseated).  Avoid alcoholic beverages for 24 hours or as instructed.  MEDICATIONS You may resume your normal medications unless your doctor tells you otherwise.  WHAT YOU CAN EXPECT TODAY Some feelings of bloating in the abdomen.  Passage of more gas than usual.  Spotting of blood in your stool or on the toilet paper.  IF YOU HAD POLYPS REMOVED DURING THE COLONOSCOPY: No aspirin products for 7 days or as instructed.  No alcohol for 7 days or as instructed.  Eat a soft diet for the next 24 hours.  FINDING OUT THE RESULTS OF YOUR TEST Not all test results  are available during your visit. If your test results are not back during the visit, make an appointment with your caregiver to find out the results. Do not assume everything is normal if you have not heard from your caregiver or the medical facility. It is important for you to follow up on all of your test results.  SEEK IMMEDIATE MEDICAL ATTENTION IF: You have more than a  spotting of blood in your stool.  Your belly is swollen (abdominal distention).  You are nauseated or vomiting.  You have a temperature over 101.  You have abdominal pain or discomfort that is severe or gets worse throughout the day.   Your EGD revealed mild amount inflammation in your stomach.  I took biopsies of this to rule out infection with a bacteria called H. pylori.  Your esophagus was wide open.  I did not need to dilate today.  I did take samples of your esophagus however.  Small bowel was normal.  Continue on pantoprazole  twice daily.  Your colonoscopy revealed 1 polyp(s) which I removed successfully. Await pathology results, my office will contact you. I recommend repeating colonoscopy in 7-10 years for surveillance purposes, depending on pathology results.  Otherwise follow up with GI in 3 months   I hope you have a great rest of your week!  Carlin POUR. Cindie, D.O. Gastroenterology and Hepatology [facility]

## 2024-12-24 NOTE — Transfer of Care (Signed)
 Immediate Anesthesia Transfer of Care Note  Patient: Amy Andersen  Procedure(s) Performed: COLONOSCOPY EGD (ESOPHAGOGASTRODUODENOSCOPY) DILATION, ESOPHAGUS  Patient Location: Short Stay  Anesthesia Type:General  Level of Consciousness: awake  Airway & Oxygen Therapy: Patient Spontanous Breathing  Post-op Assessment: Report given to RN  Post vital signs: Reviewed and stable  Last Vitals:  Vitals Value Taken Time  BP    Temp    Pulse    Resp    SpO2      Last Pain:  Vitals:   12/24/24 0957  TempSrc: Oral  PainSc: 0-No pain         Complications: No notable events documented.

## 2024-12-24 NOTE — Anesthesia Preprocedure Evaluation (Addendum)
"                                    Anesthesia Evaluation  Patient identified by MRN, date of birth, ID band Patient awake    Reviewed: Allergy & Precautions, H&P , NPO status , Patient's Chart, lab work & pertinent test results, reviewed documented beta blocker date and time   Airway Mallampati: II  TM Distance: >3 FB Neck ROM: full    Dental no notable dental hx.    Pulmonary Current Smoker and Patient abstained from smoking.   Pulmonary exam normal breath sounds clear to auscultation       Cardiovascular Exercise Tolerance: Good hypertension, negative cardio ROS  Rhythm:regular Rate:Normal     Neuro/Psych negative neurological ROS  negative psych ROS   GI/Hepatic negative GI ROS, Neg liver ROS,,,  Endo/Other  negative endocrine ROS    Renal/GU negative Renal ROS  negative genitourinary   Musculoskeletal  (+) Arthritis ,    Abdominal   Peds  Hematology negative hematology ROS (+) Blood dyscrasia, anemia   Anesthesia Other Findings   Reproductive/Obstetrics negative OB ROS                              Anesthesia Physical Anesthesia Plan  ASA: 2  Anesthesia Plan: General   Post-op Pain Management:    Induction:   PONV Risk Score and Plan: Propofol  infusion  Airway Management Planned:   Additional Equipment:   Intra-op Plan:   Post-operative Plan:   Informed Consent: I have reviewed the patients History and Physical, chart, labs and discussed the procedure including the risks, benefits and alternatives for the proposed anesthesia with the patient or authorized representative who has indicated his/her understanding and acceptance.     Dental Advisory Given  Plan Discussed with: CRNA  Anesthesia Plan Comments:          Anesthesia Quick Evaluation  "

## 2024-12-25 NOTE — Anesthesia Postprocedure Evaluation (Signed)
"   Anesthesia Post Note  Patient: Amy Andersen  Procedure(s) Performed: COLONOSCOPY EGD (ESOPHAGOGASTRODUODENOSCOPY) DILATION, ESOPHAGUS POLYPECTOMY, INTESTINE  Patient location during evaluation: Phase II Anesthesia Type: General Level of consciousness: awake Pain management: pain level controlled Vital Signs Assessment: post-procedure vital signs reviewed and stable Respiratory status: spontaneous breathing and respiratory function stable Cardiovascular status: blood pressure returned to baseline and stable Postop Assessment: no headache and no apparent nausea or vomiting Anesthetic complications: no Comments: Late entry   No notable events documented.   Last Vitals:  Vitals:   12/24/24 0957 12/24/24 1135  BP: (!) 150/80 110/67  Pulse: (!) 110 80  Resp: 19 20  Temp: 36.8 C 36.7 C  SpO2: 99% 100%    Last Pain:  Vitals:   12/24/24 1135  TempSrc: Oral  PainSc: 0-No pain                 [name] PARAS Filbert Craze      "

## 2024-12-27 ENCOUNTER — Encounter (HOSPITAL_COMMUNITY): Payer: Self-pay | Admitting: Internal Medicine

## 2024-12-27 LAB — SURGICAL PATHOLOGY

## 2024-12-30 ENCOUNTER — Ambulatory Visit: Payer: Self-pay | Admitting: Internal Medicine

## 2025-01-03 NOTE — Telephone Encounter (Signed)
 10 yr TCS noted in recall Patient's PCP is on EPIC .

## 2025-01-26 ENCOUNTER — Ambulatory Visit: Admitting: Family Medicine
